# Patient Record
Sex: Female | Born: 1935 | Race: White | Hispanic: No | Marital: Married | Smoking: Former smoker
Health system: Southern US, Community
[De-identification: ages and names within clinical notes are randomized; demographics above are authoritative.]

## PROBLEM LIST (undated history)

## (undated) DIAGNOSIS — M1990 Unspecified osteoarthritis, unspecified site: Secondary | ICD-10-CM

## (undated) DIAGNOSIS — D1800 Hemangioma unspecified site: Secondary | ICD-10-CM

## (undated) DIAGNOSIS — K5790 Diverticulosis of intestine, part unspecified, without perforation or abscess without bleeding: Secondary | ICD-10-CM

## (undated) DIAGNOSIS — R87629 Unspecified abnormal cytological findings in specimens from vagina: Secondary | ICD-10-CM

## (undated) DIAGNOSIS — R519 Headache, unspecified: Secondary | ICD-10-CM

## (undated) DIAGNOSIS — D510 Vitamin B12 deficiency anemia due to intrinsic factor deficiency: Secondary | ICD-10-CM

## (undated) DIAGNOSIS — H409 Unspecified glaucoma: Secondary | ICD-10-CM

## (undated) DIAGNOSIS — D4989 Neoplasm of unspecified behavior of other specified sites: Secondary | ICD-10-CM

## (undated) DIAGNOSIS — R51 Headache: Secondary | ICD-10-CM

## (undated) DIAGNOSIS — N390 Urinary tract infection, site not specified: Secondary | ICD-10-CM

## (undated) DIAGNOSIS — D649 Anemia, unspecified: Secondary | ICD-10-CM

## (undated) DIAGNOSIS — L719 Rosacea, unspecified: Secondary | ICD-10-CM

## (undated) HISTORY — PX: BUNIONECTOMY: SHX129

## (undated) HISTORY — PX: OOPHORECTOMY: SHX86

## (undated) HISTORY — PX: ABDOMINAL HYSTERECTOMY: SHX81

## (undated) HISTORY — PX: EYE SURGERY: SHX253

## (undated) HISTORY — PX: TONSILLECTOMY: SUR1361

---

## 2003-07-14 HISTORY — PX: CATARACT EXTRACTION, BILATERAL: SHX1313

## 2004-05-18 ENCOUNTER — Emergency Department: Payer: Self-pay | Admitting: Emergency Medicine

## 2004-05-25 ENCOUNTER — Emergency Department: Payer: Self-pay | Admitting: Emergency Medicine

## 2004-05-26 ENCOUNTER — Ambulatory Visit (HOSPITAL_COMMUNITY): Admission: RE | Admit: 2004-05-26 | Discharge: 2004-05-26 | Payer: Self-pay | Admitting: Specialist

## 2004-06-09 ENCOUNTER — Ambulatory Visit (HOSPITAL_COMMUNITY): Admission: RE | Admit: 2004-06-09 | Discharge: 2004-06-09 | Payer: Self-pay | Admitting: Specialist

## 2004-07-05 ENCOUNTER — Emergency Department: Payer: Self-pay | Admitting: Emergency Medicine

## 2004-08-13 ENCOUNTER — Ambulatory Visit: Payer: Self-pay | Admitting: Unknown Physician Specialty

## 2004-09-09 ENCOUNTER — Ambulatory Visit: Payer: Self-pay | Admitting: Unknown Physician Specialty

## 2004-09-17 ENCOUNTER — Encounter: Payer: Self-pay | Admitting: Unknown Physician Specialty

## 2004-10-11 ENCOUNTER — Encounter: Payer: Self-pay | Admitting: Unknown Physician Specialty

## 2005-03-04 ENCOUNTER — Ambulatory Visit: Payer: Self-pay | Admitting: Obstetrics and Gynecology

## 2005-03-14 ENCOUNTER — Emergency Department: Payer: Self-pay | Admitting: Unknown Physician Specialty

## 2005-05-07 ENCOUNTER — Ambulatory Visit: Payer: Self-pay | Admitting: Urology

## 2005-05-15 ENCOUNTER — Ambulatory Visit: Payer: Self-pay | Admitting: Urology

## 2005-08-13 ENCOUNTER — Ambulatory Visit: Payer: Self-pay | Admitting: Gastroenterology

## 2006-01-26 ENCOUNTER — Ambulatory Visit: Payer: Self-pay | Admitting: Gastroenterology

## 2006-03-09 ENCOUNTER — Ambulatory Visit: Payer: Self-pay | Admitting: Obstetrics and Gynecology

## 2006-08-05 ENCOUNTER — Ambulatory Visit: Payer: Self-pay | Admitting: Gastroenterology

## 2007-01-19 IMAGING — US US RENAL KIDNEY
1 series · 17 of 25 positions shown · non-contrast
Comparison: none

REASON FOR EXAM: Recurent UTI's
COMMENTS:

[Series 1: us renal kidney · 17 of 29 slices shown]
[im 1/29]
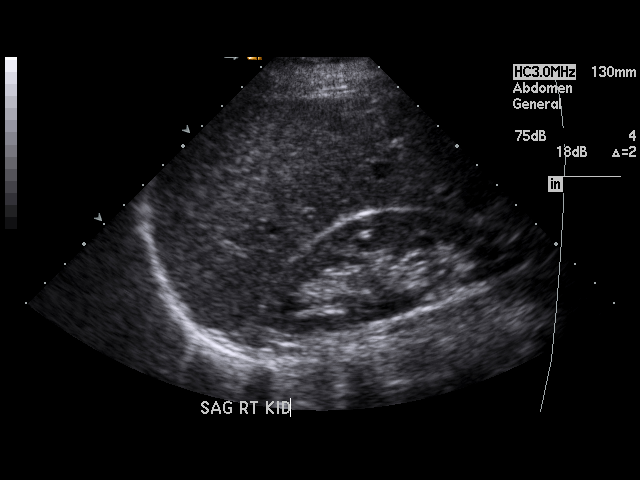
[im 3/29]
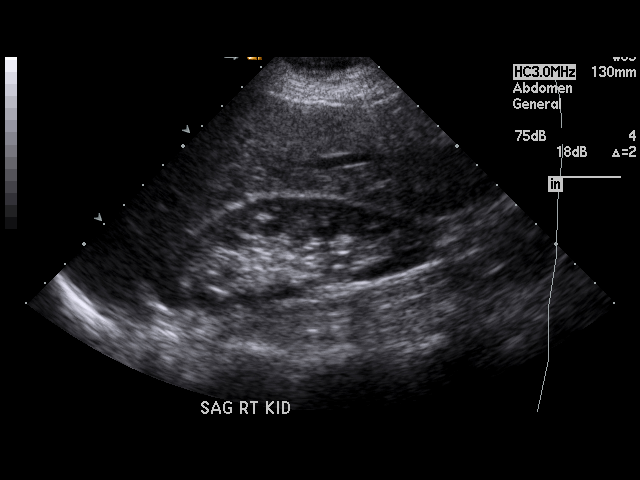
[im 4/29]
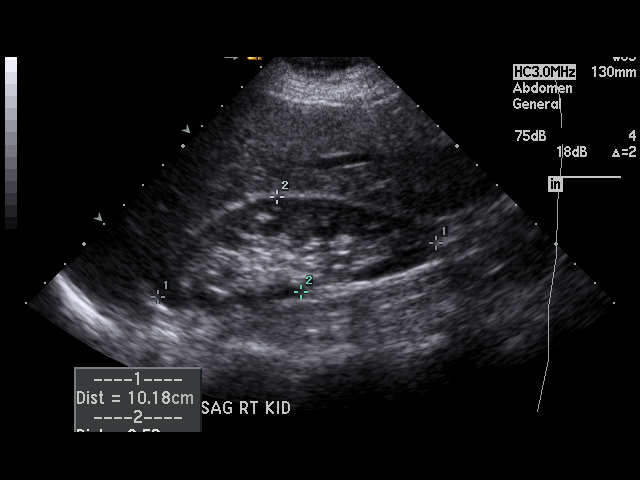
[im 6/29]
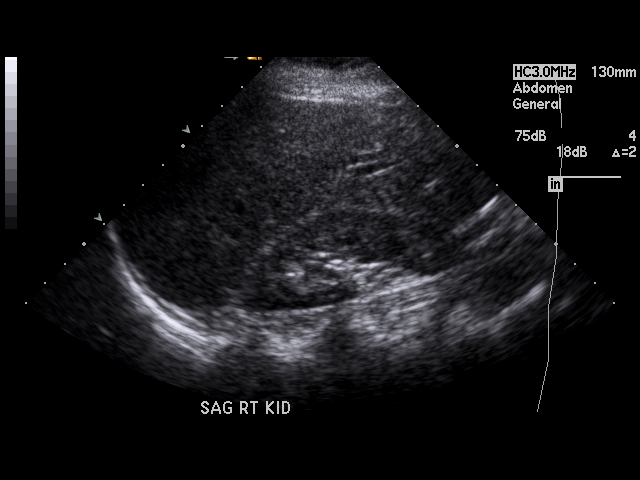
[im 8/29]
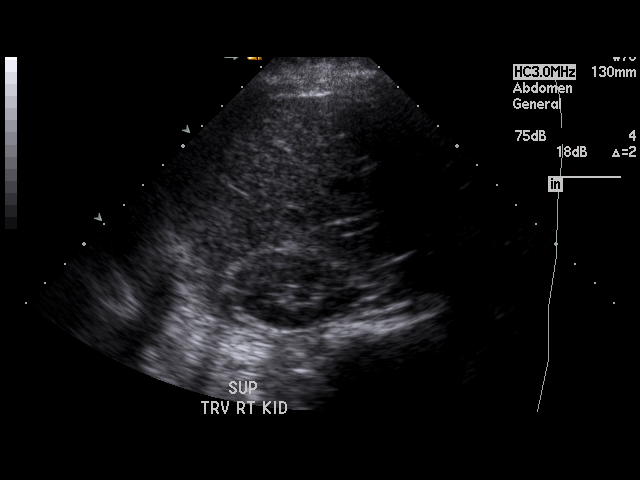
[im 10/29]
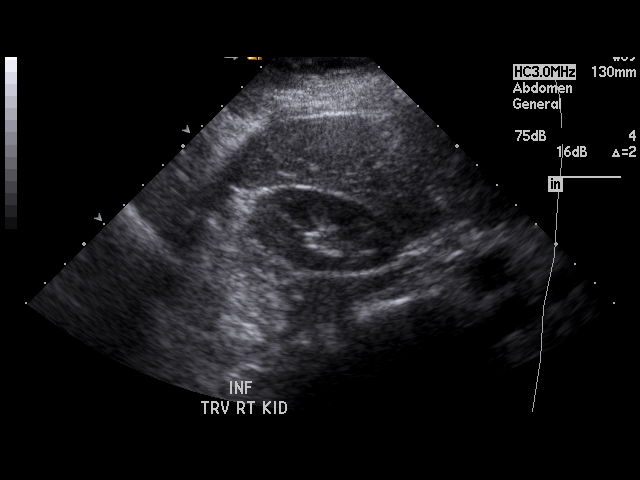
[im 11/29]
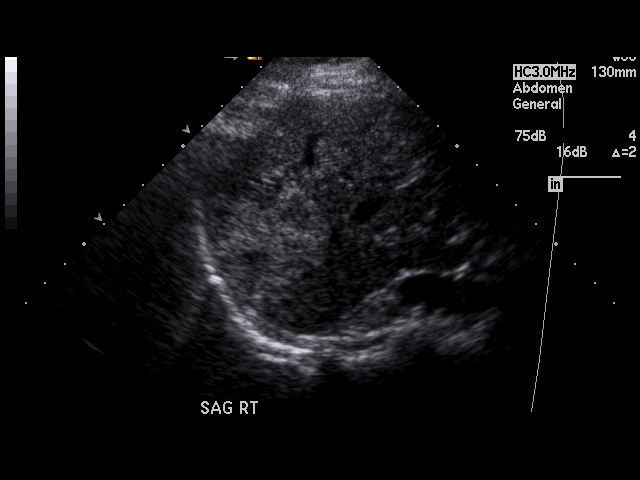
[im 13/29]
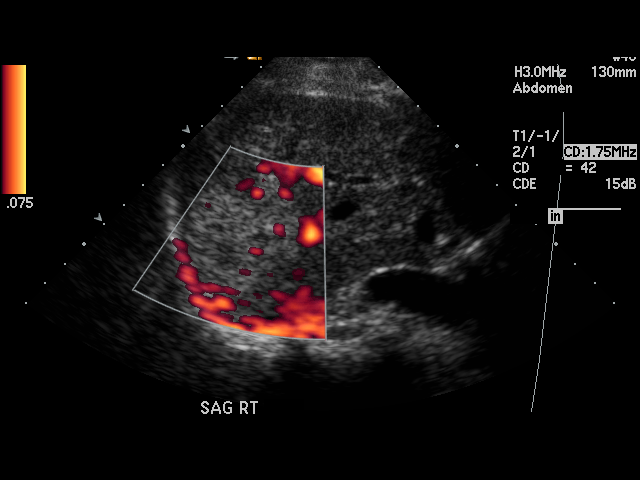
[im 15/29]
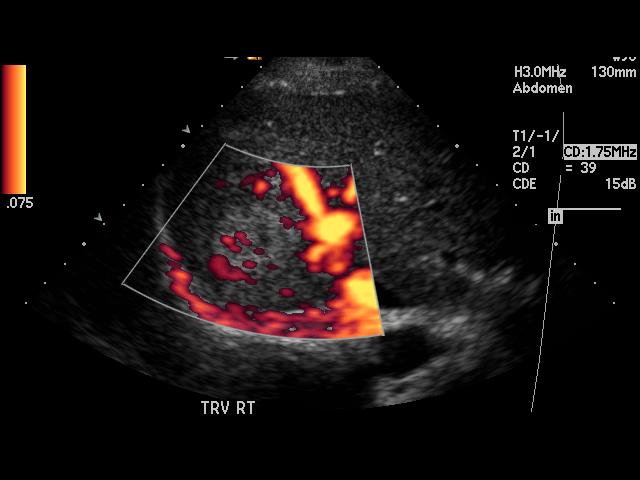
[im 16/29]
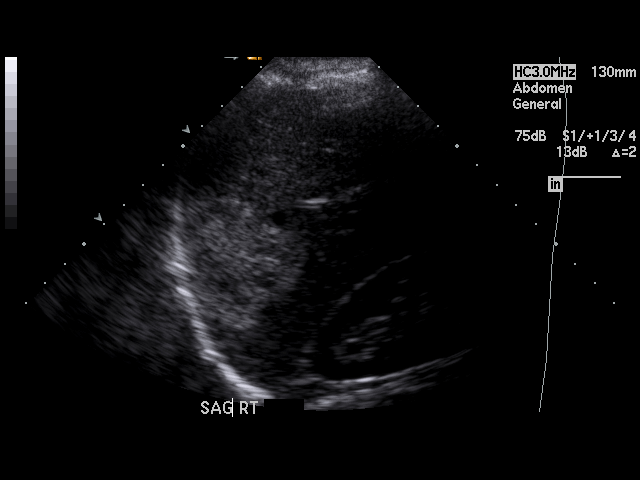
[im 18/29]
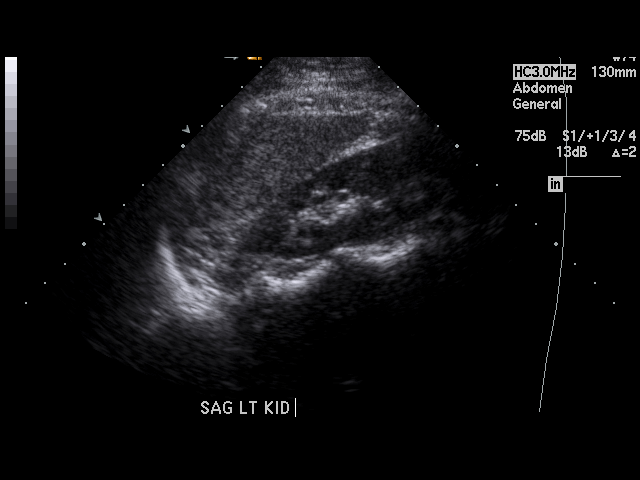
[im 19/29]
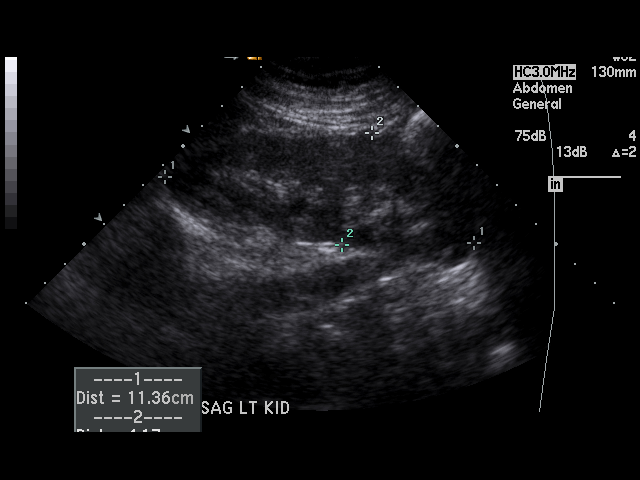
[im 22/29]
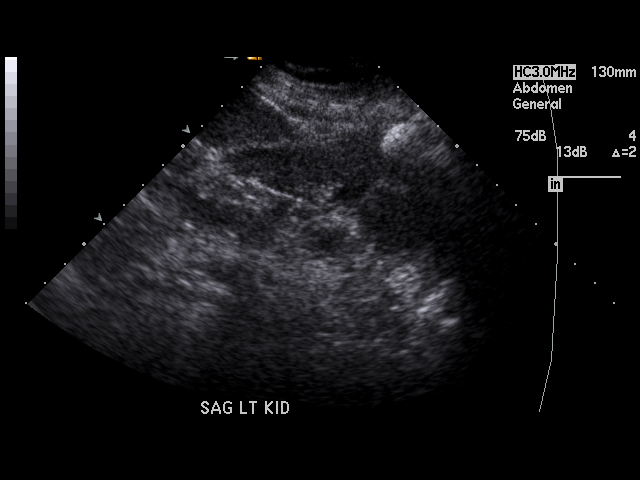
[im 23/29]
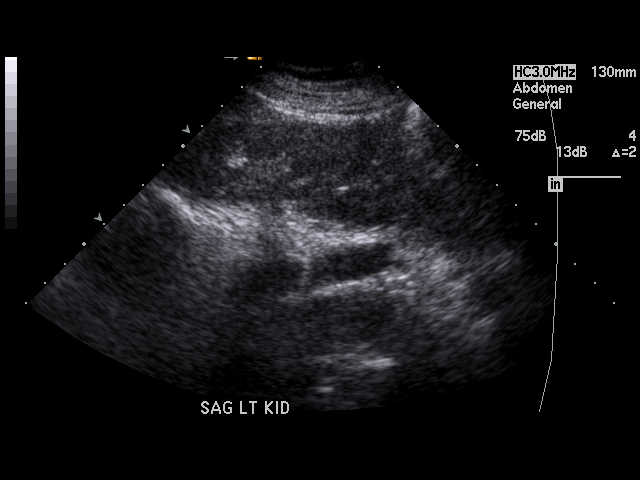
[im 25/29]
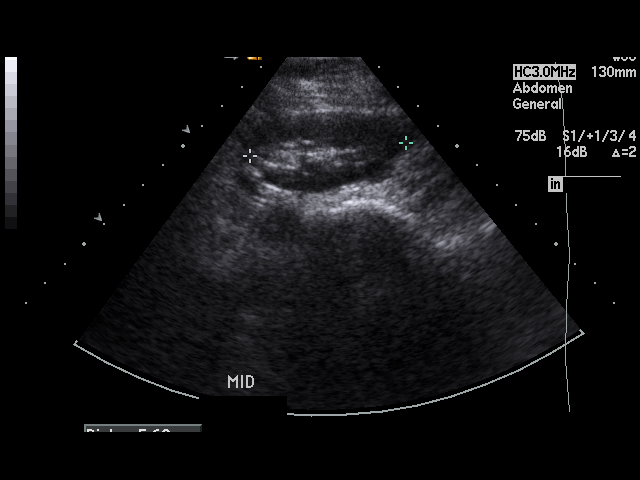
[im 26/29]
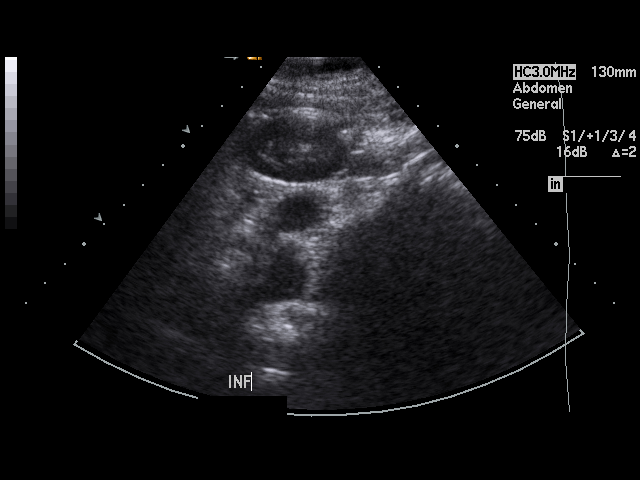
[im 29/29]
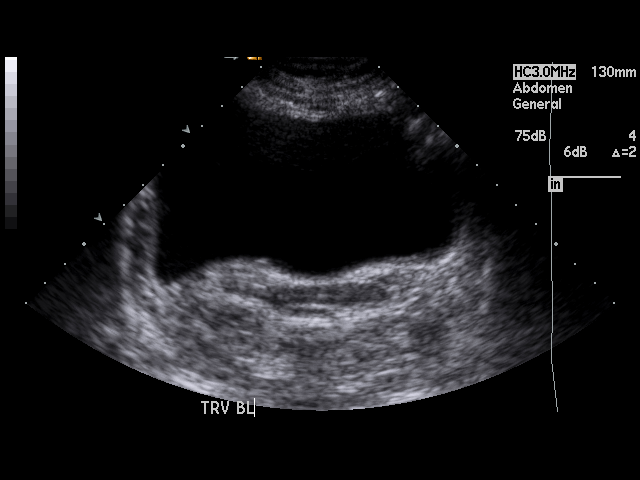

[17 of 25 positions shown; findings below may reference images not displayed]

PROCEDURE:     US  - US KIDNEY BILATERAL  - May 07, 2005  [DATE]

RESULT:     The RIGHT kidney measures 10.18 cm x 3.52 cm. The LEFT kidney
measures 11.27 cm x 4.12 cm. No solid or cystic renal mass lesions are seen.
No renal calcifications are identified. There is no hydronephrosis. The
visualized portion of the urinary bladder is normal in appearance. No
ascites is seen.

Incidental note is made of an ill-defined hypoechoic area in the RIGHT lobe
of the liver. The appearance is suspicious for a hepatic mass measuring
approximately 4.8 cm at maximum diameter. Further evaluation of the liver by
CT is suggested.
IMPRESSION: 1.     No significant sonographic abnormalities of the kidneys are
identified.
2.     Incidental views of the liver were obtained and show a hyperechoic
area suspicious for a hepatic mass lesion and for which hepatic CT is
recommended for further evaluation.

## 2007-03-11 ENCOUNTER — Ambulatory Visit: Payer: Self-pay | Admitting: Obstetrics and Gynecology

## 2007-08-04 ENCOUNTER — Ambulatory Visit: Payer: Self-pay | Admitting: Gastroenterology

## 2008-03-13 ENCOUNTER — Ambulatory Visit: Payer: Self-pay | Admitting: Obstetrics and Gynecology

## 2009-04-11 ENCOUNTER — Ambulatory Visit: Payer: Self-pay

## 2010-05-19 ENCOUNTER — Ambulatory Visit: Payer: Self-pay | Admitting: Obstetrics and Gynecology

## 2010-07-13 HISTORY — PX: OTHER SURGICAL HISTORY: SHX169

## 2012-09-08 ENCOUNTER — Ambulatory Visit: Payer: Self-pay | Admitting: General Practice

## 2015-08-30 ENCOUNTER — Encounter: Payer: Self-pay | Admitting: *Deleted

## 2015-09-02 ENCOUNTER — Ambulatory Visit: Payer: Medicare Other | Admitting: Anesthesiology

## 2015-09-02 ENCOUNTER — Encounter
Admission: RE | Disposition: A | Discharge status: Home / Self Care | Payer: Self-pay | Source: Ambulatory Visit | Attending: Gastroenterology

## 2015-09-02 ENCOUNTER — Encounter: Payer: Self-pay | Admitting: Anesthesiology

## 2015-09-02 ENCOUNTER — Ambulatory Visit
Admission: RE | Admit: 2015-09-02 | Discharge: 2015-09-02 | Disposition: A | Discharge status: Home / Self Care | Payer: Medicare Other | Source: Ambulatory Visit | Attending: Gastroenterology | Admitting: Gastroenterology

## 2015-09-02 DIAGNOSIS — Z882 Allergy status to sulfonamides status: Secondary | ICD-10-CM | POA: Diagnosis not present

## 2015-09-02 DIAGNOSIS — Q438 Other specified congenital malformations of intestine: Secondary | ICD-10-CM | POA: Insufficient documentation

## 2015-09-02 DIAGNOSIS — Z888 Allergy status to other drugs, medicaments and biological substances status: Secondary | ICD-10-CM | POA: Diagnosis not present

## 2015-09-02 DIAGNOSIS — K624 Stenosis of anus and rectum: Secondary | ICD-10-CM | POA: Diagnosis not present

## 2015-09-02 DIAGNOSIS — Z79899 Other long term (current) drug therapy: Secondary | ICD-10-CM | POA: Insufficient documentation

## 2015-09-02 DIAGNOSIS — K625 Hemorrhage of anus and rectum: Secondary | ICD-10-CM | POA: Insufficient documentation

## 2015-09-02 DIAGNOSIS — K5730 Diverticulosis of large intestine without perforation or abscess without bleeding: Secondary | ICD-10-CM | POA: Diagnosis not present

## 2015-09-02 HISTORY — DX: Neoplasm of unspecified behavior of other specified sites: D49.89

## 2015-09-02 HISTORY — DX: Anemia, unspecified: D64.9

## 2015-09-02 HISTORY — DX: Urinary tract infection, site not specified: N39.0

## 2015-09-02 HISTORY — PX: COLONOSCOPY WITH PROPOFOL: SHX5780

## 2015-09-02 HISTORY — DX: Rosacea, unspecified: L71.9

## 2015-09-02 HISTORY — DX: Unspecified abnormal cytological findings in specimens from vagina: R87.629

## 2015-09-02 HISTORY — DX: Hemangioma unspecified site: D18.00

## 2015-09-02 HISTORY — DX: Unspecified glaucoma: H40.9

## 2015-09-02 HISTORY — DX: Headache, unspecified: R51.9

## 2015-09-02 HISTORY — DX: Headache: R51

## 2015-09-02 SURGERY — COLONOSCOPY WITH PROPOFOL
Anesthesia: General

## 2015-09-02 MED ORDER — SODIUM CHLORIDE 0.9 % IV SOLN
INTRAVENOUS | Status: DC
  Administered 2015-09-02: 09:00:00 via INTRAVENOUS

## 2015-09-02 MED ORDER — PROPOFOL 10 MG/ML IV BOLUS
INTRAVENOUS | Status: DC | PRN
  Administered 2015-09-02: 50 mg via INTRAVENOUS

## 2015-09-02 MED ORDER — PROPOFOL 500 MG/50ML IV EMUL
INTRAVENOUS | Status: DC | PRN
  Administered 2015-09-02: 100 ug/kg/min via INTRAVENOUS

## 2015-09-02 MED ORDER — EPHEDRINE SULFATE 50 MG/ML IJ SOLN
INTRAMUSCULAR | Status: DC | PRN
  Administered 2015-09-02: 20 mg via INTRAVENOUS

## 2015-09-02 MED ORDER — SODIUM CHLORIDE 0.9 % IV SOLN
INTRAVENOUS | Status: DC
Start: 2015-09-02 — End: 2015-09-02
  Administered 2015-09-02: 09:00:00 via INTRAVENOUS

## 2015-09-02 NOTE — H&P (Signed)
Outpatient short stay form Pre-procedure 09/02/2015 8:53 AM Lollie Sails MD  Primary Physician: Mortimer Fries PA  Reason for visit:  Colonoscopy  History of present illness:  Patient is a 82 year old female presenting today for colonoscopy. She is a little bit overdue on her: Cancer screening. She has been having some rectal bleeding and her minimally and has a history of hemorrhoids. There is no family history of colon cancer or colon polyps. He had some nausea with her prep but states that she is going clear this morning. Takes no aspirin or blood thinning products.    Current facility-administered medications:  .  0.9 %  sodium chloride infusion, , Intravenous, Continuous, Lollie Sails, MD  Prescriptions prior to admission  Medication Sig Dispense Refill Last Dose  . alclomethasone (ACLOVATE) 0.05 % ointment Apply topically 2 (two) times daily.     . CYANOCOBALAMIN IJ Inject 1,000 mcg as directed.     . fluocinonide cream (LIDEX) [id number] % Apply 1 application topically 2 (two) times daily.     . hydrocortisone (ANUSOL-HC) 2.5 % rectal cream Place 1 application rectally 2 (two) times daily.     Marland Kitchen ibuprofen (ADVIL) 200 MG tablet Take 400 mg by mouth 3 (three) times daily.     Marland Kitchen latanoprost (XALATAN) 0.005 % ophthalmic solution Place 1 drop into both eyes at bedtime.     . metroNIDAZOLE (METROCREAM) 0.75 % cream Apply topically 2 (two) times daily.     . polyethylene glycol powder (GLYCOLAX/MIRALAX) powder Take 1 Container by mouth once.        Allergies  Allergen Reactions  . Prednisone   . Pyridium [Phenazopyridine Hcl]   . Sulfa Antibiotics      Past Medical History  Diagnosis Date  . Abnormal Pap smear of vagina   . Glaucoma (increased eye pressure)   . Headache   . Multiple hemangiomas   . Anemia     pernicious  . Rosacea   . Tumor of jaw   . UTI (urinary tract infection)     Review of systems:      Physical Exam    Heart and lungs: Regular rate and  rhythm without rub or gallop, lungs are bilaterally clear.    HEENT: Normocephalic atraumatic eyes are anicteric.    Other:     Pertinant exam for procedure: Soft nontender nondistended bowel sounds positive normoactive.    Planned proceedures: Colonoscopy and indicated procedures. I have discussed the risks benefits and complications of procedures to include not limited to bleeding, infection, perforation and the risk of sedation and the patient wishes to proceed.    Lollie Sails, MD Gastroenterology 09/02/2015  8:53 AM

## 2015-09-02 NOTE — Op Note (Signed)
[facility] Gastroenterology Patient Name: Monica Gonzales Procedure Date: 09/02/2015 9:18 AM MRN: [id number] Account #: [id number] Date of Birth: 06/19/1934 Admit Type: Outpatient Age: 82 Room: [facility] ENDO ROOM 3 Gender: Female Note Status: Finalized Procedure:            Colonoscopy Indications:          Rectal bleeding Providers:            Lollie Sails, MD Referring MD:         Youlanda Roys. Ola Spurr, MD (Referring MD) Medicines:            Monitored Anesthesia Care Complications:        No immediate complications. Procedure:            Pre-Anesthesia Assessment:                       - ASA Grade Assessment: II - A patient with mild                        systemic disease.                       After obtaining informed consent, the colonoscope was                        passed under direct vision. Throughout the procedure,                        the patient's blood pressure, pulse, and oxygen                        saturations were monitored continuously. The                        Colonoscope was introduced through the anus with the                        intention of advancing to the cecum. The scope was                        advanced to the descending colon before the procedure                        was aborted. Medications were given. The quality of the                        bowel preparation was good. Findings:      Multiple small-mouthed diverticula were found in the sigmoid colon and       descending colon.      A benign-appearing, intrinsic moderate stenosis measuring less than one       cm (in length) x 1.3 cm (inner diameter) was found at the anus and was       traversed.      The sigmoid colon and descending colon were significantly tortuous.      The scope was advanced to the area of the mid to proximal descending       before encountering a very sharp angulation the could not be negotiated       despite multiple position changes, position  change and change of scope.  The lumen appeared to be very narrow at this point as well. Impression:           - Diverticulosis in the sigmoid colon and in the                        descending colon.                       - Stricture at the anus.                       - Tortuous colon.                       - No specimens collected. Recommendation:       - Discharge patient to home.                       - Perform an air contrast barium enema at appointment                        to be scheduled.                       - Will discuss the anal stenosis further with patient,                        and refer to Surgery for possible management if desired. Procedure Code(s):    --- Professional ---                       [phone], 59, Colonoscopy, flexible; diagnostic, including                        collection of specimen(s) by brushing or washing, when                        performed (separate procedure) Diagnosis Code(s):    --- Professional ---                       K62.4, Stenosis of anus and rectum                       K62.5, Hemorrhage of anus and rectum                       K57.30, Diverticulosis of large intestine without                        perforation or abscess without bleeding                       Q43.8, Other specified congenital malformations of                        intestine CPT copyright 2016 American Medical Association. All rights reserved. The codes documented in this report are preliminary and upon coder review may  be revised to meet current compliance requirements. Lollie Sails, MD 09/02/2015 9:54:35 AM This report has been signed electronically. Number of Addenda: 0 Note Initiated On: 09/02/2015 9:18 AM Total Procedure Duration: 0 hours 22 minutes 48 seconds  [facility]

## 2015-09-02 NOTE — Anesthesia Preprocedure Evaluation (Addendum)
Anesthesia Evaluation  Patient identified by MRN, date of birth, ID band Patient awake    Reviewed: Allergy & Precautions, H&P , NPO status , Patient's Chart, lab work & pertinent test results, reviewed documented beta blocker date and time   History of Anesthesia Complications Negative for: history of anesthetic complications  Airway Mallampati: II  TM Distance: <3 FB Neck ROM: full  Mouth opening: Limited Mouth Opening  Dental no notable dental hx. (+) Upper Dentures, Edentulous Upper, Missing   Pulmonary neg pulmonary ROS,    Pulmonary exam normal breath sounds clear to auscultation       Cardiovascular Exercise Tolerance: Good negative cardio ROS Normal cardiovascular exam Rhythm:regular Rate:Normal     Neuro/Psych negative neurological ROS  negative psych ROS   GI/Hepatic negative GI ROS, Neg liver ROS,   Endo/Other  negative endocrine ROS  Renal/GU negative Renal ROS  negative genitourinary   Musculoskeletal   Abdominal   Peds  Hematology negative hematology ROS (+)   Anesthesia Other Findings Past Medical History:   Abnormal Pap smear of vagina                                 Glaucoma (increased eye pressure)                            Headache                                                     Multiple hemangiomas                                         Anemia                                                         Comment:pernicious   Rosacea                                                      Tumor of jaw                                                 UTI (urinary tract infection)                                Reproductive/Obstetrics negative OB ROS                             Anesthesia Physical Anesthesia Plan  ASA: II  Anesthesia Plan: General   Post-op Pain Management:    Induction:   Airway Management Planned:  Additional Equipment:   Intra-op Plan:    Post-operative Plan:   Informed Consent: I have reviewed the patients History and Physical, chart, labs and discussed the procedure including the risks, benefits and alternatives for the proposed anesthesia with the patient or authorized representative who has indicated his/her understanding and acceptance.   Dental Advisory Given  Plan Discussed with: Anesthesiologist, CRNA and Surgeon  Anesthesia Plan Comments:        Anesthesia Quick Evaluation

## 2015-09-02 NOTE — Anesthesia Postprocedure Evaluation (Signed)
Anesthesia Post Note  Patient: Monica Gonzales  Procedure(s) Performed: Procedure(s) (LRB): COLONOSCOPY WITH PROPOFOL (N/A)  Patient location during evaluation: PACU Anesthesia Type: General Level of consciousness: awake and alert Pain management: pain level controlled Vital Signs Assessment: post-procedure vital signs reviewed and stable Respiratory status: spontaneous breathing, nonlabored ventilation, respiratory function stable and patient connected to nasal cannula oxygen Cardiovascular status: blood pressure returned to baseline and stable Postop Assessment: no signs of nausea or vomiting Anesthetic complications: no    Last Vitals:  Filed Vitals:   09/02/15 1010 09/02/15 1020  BP: 128/62 128/56  Pulse: 98 99  Temp:    Resp: 19 18    Last Pain: There were no vitals filed for this visit.               Martha Clan

## 2015-09-02 NOTE — Transfer of Care (Signed)
Immediate Anesthesia Transfer of Care Note  Patient: Monica Gonzales  Procedure(s) Performed: Procedure(s): COLONOSCOPY WITH PROPOFOL (N/A)  Patient Location: PACU  Anesthesia Type:General  Level of Consciousness: awake  Airway & Oxygen Therapy: Patient Spontanous Breathing  Post-op Assessment: Report given to RN  Post vital signs: Reviewed and stable  Last Vitals:  Filed Vitals:   09/02/15 0900 09/02/15 0952  BP: 152/61 117/49  Pulse: 81 85  Temp: 36.7 C   Resp: 17 22    Complications: No apparent anesthesia complications

## 2015-09-03 ENCOUNTER — Encounter: Payer: Self-pay | Admitting: Gastroenterology

## 2015-09-03 ENCOUNTER — Other Ambulatory Visit: Payer: Self-pay | Admitting: Gastroenterology

## 2015-09-03 DIAGNOSIS — Q438 Other specified congenital malformations of intestine: Secondary | ICD-10-CM

## 2015-09-12 ENCOUNTER — Ambulatory Visit: Payer: Medicare Other

## 2016-09-29 ENCOUNTER — Inpatient Hospital Stay
Admission: RE | Admit: 2016-09-29 | Discharge: 2016-09-29 | Disposition: A | Discharge status: Home / Self Care | Payer: Self-pay | Source: Ambulatory Visit | Attending: *Deleted | Admitting: *Deleted

## 2016-09-29 ENCOUNTER — Other Ambulatory Visit: Payer: Self-pay | Admitting: *Deleted

## 2016-09-29 DIAGNOSIS — Z9289 Personal history of other medical treatment: Secondary | ICD-10-CM

## 2016-10-05 ENCOUNTER — Other Ambulatory Visit: Payer: Self-pay | Admitting: Infectious Diseases

## 2016-10-05 DIAGNOSIS — Z1231 Encounter for screening mammogram for malignant neoplasm of breast: Secondary | ICD-10-CM

## 2016-10-30 ENCOUNTER — Ambulatory Visit
Admission: RE | Admit: 2016-10-30 | Discharge: 2016-10-30 | Disposition: A | Discharge status: Home / Self Care | Payer: Medicare Other | Source: Ambulatory Visit | Attending: Infectious Diseases | Admitting: Infectious Diseases

## 2016-10-30 DIAGNOSIS — Z1231 Encounter for screening mammogram for malignant neoplasm of breast: Secondary | ICD-10-CM | POA: Diagnosis present

## 2018-05-10 ENCOUNTER — Encounter
Admission: RE | Admit: 2018-05-10 | Discharge: 2018-05-10 | Disposition: A | Discharge status: Home / Self Care | Payer: Medicare Other | Source: Ambulatory Visit | Attending: Orthopedic Surgery | Admitting: Orthopedic Surgery

## 2018-05-10 ENCOUNTER — Other Ambulatory Visit: Payer: Self-pay

## 2018-05-10 DIAGNOSIS — Z01818 Encounter for other preprocedural examination: Secondary | ICD-10-CM | POA: Diagnosis not present

## 2018-05-10 DIAGNOSIS — Z01810 Encounter for preprocedural cardiovascular examination: Secondary | ICD-10-CM

## 2018-05-10 HISTORY — DX: Vitamin B12 deficiency anemia due to intrinsic factor deficiency: D51.0

## 2018-05-10 HISTORY — DX: Diverticulosis of intestine, part unspecified, without perforation or abscess without bleeding: K57.90

## 2018-05-10 HISTORY — DX: Unspecified osteoarthritis, unspecified site: M19.90

## 2018-05-10 LAB — BASIC METABOLIC PANEL
ANION GAP: 10 (ref 5–15)
BUN: 12 mg/dL (ref 8–23)
CALCIUM: 9.3 mg/dL (ref 8.9–10.3)
CHLORIDE: 100 mmol/L (ref 98–111)
CO2: 28 mmol/L (ref 22–32)
CREATININE: 0.83 mg/dL (ref 0.44–1.00)
Glucose, Bld: 95 mg/dL (ref 70–99)
Potassium: 4 mmol/L (ref 3.5–5.1)
Sodium: 138 mmol/L (ref 135–145)

## 2018-05-10 LAB — PROTIME-INR
INR: 0.95
Prothrombin Time: 12.6 seconds (ref 11.4–15.2)

## 2018-05-10 LAB — SURGICAL PCR SCREEN
MRSA, PCR: NEGATIVE
Staphylococcus aureus: NEGATIVE

## 2018-05-10 LAB — URINALYSIS, ROUTINE W REFLEX MICROSCOPIC
BILIRUBIN URINE: NEGATIVE
Glucose, UA: NEGATIVE mg/dL
Ketones, ur: NEGATIVE mg/dL
Leukocytes, UA: NEGATIVE
Nitrite: NEGATIVE
PROTEIN: NEGATIVE mg/dL
Specific Gravity, Urine: 1.006 (ref 1.005–1.030)
pH: 6 (ref 5.0–8.0)

## 2018-05-10 LAB — CBC
HCT: 43 % (ref 36.0–46.0)
HEMOGLOBIN: 14.1 g/dL (ref 12.0–15.0)
MCH: 31.7 pg (ref 26.0–34.0)
MCHC: 32.8 g/dL (ref 30.0–36.0)
MCV: 96.6 fL (ref 80.0–100.0)
Platelets: 179 10*3/uL (ref 150–400)
RBC: 4.45 MIL/uL (ref 3.87–5.11)
RDW: 12 % (ref 11.5–15.5)
WBC: 6.6 10*3/uL (ref 4.0–10.5)
nRBC: 0 % (ref 0.0–0.2)

## 2018-05-10 LAB — APTT: aPTT: 24 seconds (ref 24–36)

## 2018-05-10 LAB — SEDIMENTATION RATE: SED RATE: 15 mm/h (ref 0–30)

## 2018-05-10 NOTE — Patient Instructions (Signed)
Your procedure is scheduled on: Tuesday, May 17, 2018 Report to Day Surgery on the 2nd floor of the [facility]. To find out your arrival time, please call [phone] between 1PM - 3PM on: Monday, May 16, 2018  REMEMBER: Instructions that are not followed completely may result in serious medical risk, up to and including death; or upon the discretion of your surgeon and anesthesiologist your surgery may need to be rescheduled.  Do not eat food after midnight the night before surgery.  No gum chewing, lozengers or hard candies.  You may however, drink CLEAR liquids up to 2 hours before you are scheduled to arrive for your surgery. Do not drink anything within 2 hours of the start of your surgery.  Clear liquids include: - water  - apple juice without pulp - gatorade - black coffee or tea (Do NOT add milk or creamers to the coffee or tea) Do NOT drink anything that is not on this list.  No Alcohol for 24 hours before or after surgery.  No Smoking including e-cigarettes for 24 hours prior to surgery.  No chewable tobacco products for at least 6 hours prior to surgery.  No nicotine patches on the day of surgery.  On the morning of surgery brush your teeth with toothpaste and water, you may rinse your mouth with mouthwash if you wish. Do not swallow any toothpaste or mouthwash.  Notify your doctor if there is any change in your medical condition (cold, fever, infection).  Do not wear jewelry, make-up, hairpins, clips or nail polish.  Do not wear lotions, powders, or perfumes.   Do not shave 48 hours prior to surgery.   Contacts and dentures may not be worn into surgery.  Do not bring valuables to the hospital, including drivers license, insurance or credit cards.  [facility] is not responsible for any belongings or valuables.   TAKE THESE MEDICATIONS THE MORNING OF SURGERY:  none  Use CHG Soap as directed on instruction sheet.  NOW!  Stop Anti-inflammatories  (NSAIDS) such as Advil, Aleve, Ibuprofen, Motrin, Naproxen, Naprosyn and Aspirin based products such as Excedrin, Goodys Powder, BC Powder. (May take Tylenol or Acetaminophen if needed.)  NOW!  Stop ANY OVER THE COUNTER supplements until after surgery. (May continue Vitamin B.)  Wear comfortable clothing (specific to your surgery type) to the hospital.  Plan for stool softeners for home use.  If you are being admitted to the hospital overnight, leave your suitcase in the car. After surgery it may be brought to your room.  Please call [phone] if you have any questions about these instructions.

## 2018-05-11 LAB — URINE CULTURE: CULTURE: NO GROWTH

## 2018-05-16 MED ORDER — CEFAZOLIN SODIUM-DEXTROSE 2-4 GM/100ML-% IV SOLN
2.0000 g | Freq: Once | INTRAVENOUS | Status: AC
  Administered 2018-05-17: 2 g via INTRAVENOUS

## 2018-05-17 ENCOUNTER — Encounter
Admission: RE | Disposition: A | Discharge status: Home Health | Payer: Self-pay | Source: Home / Self Care | Attending: Orthopedic Surgery

## 2018-05-17 ENCOUNTER — Inpatient Hospital Stay
Admission: RE | Admit: 2018-05-17 | Discharge: 2018-05-20 | DRG: 470 | Disposition: A | Discharge status: Home Health | Payer: Medicare Other | Attending: Orthopedic Surgery | Admitting: Orthopedic Surgery

## 2018-05-17 ENCOUNTER — Inpatient Hospital Stay: Payer: Medicare Other | Admitting: Anesthesiology

## 2018-05-17 ENCOUNTER — Other Ambulatory Visit: Payer: Self-pay

## 2018-05-17 ENCOUNTER — Inpatient Hospital Stay: Payer: Medicare Other

## 2018-05-17 ENCOUNTER — Encounter: Payer: Self-pay | Admitting: *Deleted

## 2018-05-17 DIAGNOSIS — L439 Lichen planus, unspecified: Secondary | ICD-10-CM | POA: Diagnosis present

## 2018-05-17 DIAGNOSIS — G8918 Other acute postprocedural pain: Secondary | ICD-10-CM

## 2018-05-17 DIAGNOSIS — Z419 Encounter for procedure for purposes other than remedying health state, unspecified: Secondary | ICD-10-CM

## 2018-05-17 DIAGNOSIS — Z833 Family history of diabetes mellitus: Secondary | ICD-10-CM

## 2018-05-17 DIAGNOSIS — H409 Unspecified glaucoma: Secondary | ICD-10-CM | POA: Diagnosis present

## 2018-05-17 DIAGNOSIS — M1611 Unilateral primary osteoarthritis, right hip: Principal | ICD-10-CM | POA: Diagnosis present

## 2018-05-17 DIAGNOSIS — Z90710 Acquired absence of both cervix and uterus: Secondary | ICD-10-CM | POA: Diagnosis not present

## 2018-05-17 DIAGNOSIS — Z8041 Family history of malignant neoplasm of ovary: Secondary | ICD-10-CM

## 2018-05-17 DIAGNOSIS — Z87891 Personal history of nicotine dependence: Secondary | ICD-10-CM

## 2018-05-17 DIAGNOSIS — Z882 Allergy status to sulfonamides status: Secondary | ICD-10-CM | POA: Diagnosis not present

## 2018-05-17 DIAGNOSIS — Z811 Family history of alcohol abuse and dependence: Secondary | ICD-10-CM

## 2018-05-17 DIAGNOSIS — Z791 Long term (current) use of non-steroidal anti-inflammatories (NSAID): Secondary | ICD-10-CM

## 2018-05-17 DIAGNOSIS — Z888 Allergy status to other drugs, medicaments and biological substances status: Secondary | ICD-10-CM

## 2018-05-17 DIAGNOSIS — Z9849 Cataract extraction status, unspecified eye: Secondary | ICD-10-CM | POA: Diagnosis not present

## 2018-05-17 DIAGNOSIS — Z79899 Other long term (current) drug therapy: Secondary | ICD-10-CM

## 2018-05-17 DIAGNOSIS — Z96641 Presence of right artificial hip joint: Secondary | ICD-10-CM

## 2018-05-17 HISTORY — PX: TOTAL HIP ARTHROPLASTY: SHX124

## 2018-05-17 LAB — ABO/RH: ABO/RH(D): A POS

## 2018-05-17 LAB — TYPE AND SCREEN
ABO/RH(D): A POS
Antibody Screen: NEGATIVE

## 2018-05-17 SURGERY — ARTHROPLASTY, HIP, TOTAL, ANTERIOR APPROACH
Anesthesia: Spinal | Site: Hip | Laterality: Right

## 2018-05-17 MED ORDER — MAGNESIUM CITRATE PO SOLN
1.0000 | Freq: Once | ORAL | Status: AC | PRN
  Administered 2018-05-19: 1 via ORAL
  Filled 2018-05-17 (×3): qty 296

## 2018-05-17 MED ORDER — TRANEXAMIC ACID-NACL 1000-0.7 MG/100ML-% IV SOLN
INTRAVENOUS | Status: DC | PRN
  Administered 2018-05-17: 1000 mg via INTRAVENOUS

## 2018-05-17 MED ORDER — BISACODYL 5 MG PO TBEC
5.0000 mg | DELAYED_RELEASE_TABLET | Freq: Every day | ORAL | Status: DC | PRN
  Administered 2018-05-19: 5 mg via ORAL
  Filled 2018-05-17: qty 1

## 2018-05-17 MED ORDER — FAMOTIDINE 20 MG PO TABS
20.0000 mg | ORAL_TABLET | Freq: Once | ORAL | Status: AC
  Administered 2018-05-17: 20 mg via ORAL

## 2018-05-17 MED ORDER — ASPIRIN 81 MG PO CHEW
81.0000 mg | CHEWABLE_TABLET | Freq: Two times a day (BID) | ORAL | Status: DC
  Administered 2018-05-17 – 2018-05-20 (×6): 81 mg via ORAL
  Filled 2018-05-17 (×6): qty 1

## 2018-05-17 MED ORDER — CEFAZOLIN SODIUM-DEXTROSE 1-4 GM/50ML-% IV SOLN
1.0000 g | Freq: Four times a day (QID) | INTRAVENOUS | Status: AC
  Administered 2018-05-17 – 2018-05-18 (×2): 1 g via INTRAVENOUS
  Filled 2018-05-17 (×2): qty 50

## 2018-05-17 MED ORDER — HYDROCODONE-ACETAMINOPHEN 5-325 MG PO TABS
1.0000 | ORAL_TABLET | ORAL | Status: DC | PRN
  Administered 2018-05-17: 1 via ORAL
  Filled 2018-05-17: qty 1

## 2018-05-17 MED ORDER — BUPIVACAINE HCL (PF) 0.5 % IJ SOLN: INTRAMUSCULAR | Status: DC | PRN

## 2018-05-17 MED ORDER — MORPHINE SULFATE (PF) 2 MG/ML IV SOLN: 0.5000 mg | INTRAVENOUS | Status: DC | PRN

## 2018-05-17 MED ORDER — SODIUM CHLORIDE 0.9 % IV SOLN
INTRAVENOUS | Status: DC | PRN
  Administered 2018-05-17: 50 ug via INTRAVENOUS

## 2018-05-17 MED ORDER — ACETAMINOPHEN 325 MG PO TABS
325.0000 mg | ORAL_TABLET | Freq: Four times a day (QID) | ORAL | Status: DC | PRN
  Filled 2018-05-17: qty 2

## 2018-05-17 MED ORDER — EPHEDRINE SULFATE 50 MG/ML IJ SOLN
INTRAMUSCULAR | Status: DC | PRN
  Administered 2018-05-17: 15 mg via INTRAVENOUS

## 2018-05-17 MED ORDER — METOCLOPRAMIDE HCL 10 MG PO TABS: 5.0000 mg | ORAL_TABLET | Freq: Three times a day (TID) | ORAL | Status: DC | PRN

## 2018-05-17 MED ORDER — PSYLLIUM 95 % PO PACK
1.0000 | PACK | Freq: Every day | ORAL | Status: DC
  Administered 2018-05-18 – 2018-05-19 (×2): 1 via ORAL
  Filled 2018-05-17 (×4): qty 1

## 2018-05-17 MED ORDER — GABAPENTIN 300 MG PO CAPS
300.0000 mg | ORAL_CAPSULE | Freq: Three times a day (TID) | ORAL | Status: DC
  Administered 2018-05-17 – 2018-05-20 (×9): 300 mg via ORAL
  Filled 2018-05-17 (×10): qty 1

## 2018-05-17 MED ORDER — FAMOTIDINE 20 MG PO TABS
ORAL_TABLET | ORAL | Status: AC
  Filled 2018-05-17: qty 1

## 2018-05-17 MED ORDER — BUPIVACAINE-EPINEPHRINE 0.25% -1:200000 IJ SOLN
INTRAMUSCULAR | Status: DC | PRN
  Administered 2018-05-17: 30 mL

## 2018-05-17 MED ORDER — MIDAZOLAM HCL 5 MG/5ML IJ SOLN
INTRAMUSCULAR | Status: DC | PRN
  Administered 2018-05-17 (×2): 1 mg via INTRAVENOUS

## 2018-05-17 MED ORDER — TRANEXAMIC ACID-NACL 1000-0.7 MG/100ML-% IV SOLN
1000.0000 mg | INTRAVENOUS | Status: DC
  Filled 2018-05-17: qty 100

## 2018-05-17 MED ORDER — BUPIVACAINE HCL (PF) 0.5 % IJ SOLN
INTRAMUSCULAR | Status: DC | PRN
  Administered 2018-05-17: 2 mL

## 2018-05-17 MED ORDER — DIPHENHYDRAMINE HCL 12.5 MG/5ML PO ELIX: 12.5000 mg | ORAL_SOLUTION | ORAL | Status: DC | PRN

## 2018-05-17 MED ORDER — PROPOFOL 500 MG/50ML IV EMUL
INTRAVENOUS | Status: DC | PRN
  Administered 2018-05-17: 50 ug/kg/min via INTRAVENOUS

## 2018-05-17 MED ORDER — ZOLPIDEM TARTRATE 5 MG PO TABS: 5.0000 mg | ORAL_TABLET | Freq: Every evening | ORAL | Status: DC | PRN

## 2018-05-17 MED ORDER — TRAMADOL HCL 50 MG PO TABS
50.0000 mg | ORAL_TABLET | Freq: Four times a day (QID) | ORAL | Status: DC
  Administered 2018-05-18 (×3): 50 mg via ORAL
  Administered 2018-05-19: 25 mg via ORAL
  Filled 2018-05-17 (×5): qty 1

## 2018-05-17 MED ORDER — PHENYLEPHRINE HCL 10 MG/ML IJ SOLN
INTRAMUSCULAR | Status: DC | PRN
  Administered 2018-05-17: 100 ug via INTRAVENOUS

## 2018-05-17 MED ORDER — METOCLOPRAMIDE HCL 5 MG/ML IJ SOLN: 5.0000 mg | Freq: Three times a day (TID) | INTRAMUSCULAR | Status: DC | PRN

## 2018-05-17 MED ORDER — ONDANSETRON HCL 4 MG PO TABS: 4.0000 mg | ORAL_TABLET | Freq: Four times a day (QID) | ORAL | Status: DC | PRN

## 2018-05-17 MED ORDER — CEFAZOLIN SODIUM-DEXTROSE 2-4 GM/100ML-% IV SOLN
INTRAVENOUS | Status: AC
  Filled 2018-05-17: qty 100

## 2018-05-17 MED ORDER — PROPOFOL 10 MG/ML IV BOLUS
INTRAVENOUS | Status: DC | PRN
  Administered 2018-05-17 (×3): 10 mg via INTRAVENOUS
  Administered 2018-05-17: 20 mg via INTRAVENOUS
  Administered 2018-05-17: 10 mg via INTRAVENOUS

## 2018-05-17 MED ORDER — LACTATED RINGERS IV SOLN
INTRAVENOUS | Status: DC
  Administered 2018-05-17 (×2): via INTRAVENOUS

## 2018-05-17 MED ORDER — ONDANSETRON HCL 4 MG/2ML IJ SOLN: 4.0000 mg | Freq: Once | INTRAMUSCULAR | Status: DC | PRN

## 2018-05-17 MED ORDER — MIDAZOLAM HCL 2 MG/2ML IJ SOLN
INTRAMUSCULAR | Status: AC
  Administered 2018-05-17: 0.5 mg via INTRAVENOUS
  Filled 2018-05-17: qty 2

## 2018-05-17 MED ORDER — DOCUSATE SODIUM 100 MG PO CAPS
100.0000 mg | ORAL_CAPSULE | Freq: Two times a day (BID) | ORAL | Status: DC
  Administered 2018-05-17 – 2018-05-19 (×5): 100 mg via ORAL
  Filled 2018-05-17 (×5): qty 1

## 2018-05-17 MED ORDER — TETRACAINE HCL 1 % IJ SOLN
INTRAMUSCULAR | Status: DC | PRN
  Administered 2018-05-17: 1 mg via INTRASPINAL

## 2018-05-17 MED ORDER — MIDAZOLAM HCL 2 MG/2ML IJ SOLN
0.5000 mg | Freq: Once | INTRAMUSCULAR | Status: AC
  Administered 2018-05-17: 0.5 mg via INTRAVENOUS

## 2018-05-17 MED ORDER — METRONIDAZOLE 0.75 % EX CREA
1.0000 "application " | TOPICAL_CREAM | Freq: Two times a day (BID) | CUTANEOUS | Status: DC | PRN
  Filled 2018-05-17: qty 45

## 2018-05-17 MED ORDER — PROPOFOL 500 MG/50ML IV EMUL
INTRAVENOUS | Status: AC
  Filled 2018-05-17: qty 50

## 2018-05-17 MED ORDER — PHENOL 1.4 % MT LIQD
1.0000 | OROMUCOSAL | Status: DC | PRN
  Filled 2018-05-17: qty 177

## 2018-05-17 MED ORDER — NEOMYCIN-POLYMYXIN B GU 40-200000 IR SOLN
Status: DC | PRN
  Administered 2018-05-17: 4 mL

## 2018-05-17 MED ORDER — METHOCARBAMOL 1000 MG/10ML IJ SOLN
500.0000 mg | Freq: Four times a day (QID) | INTRAVENOUS | Status: DC | PRN
  Filled 2018-05-17: qty 5

## 2018-05-17 MED ORDER — ALUM & MAG HYDROXIDE-SIMETH 200-200-20 MG/5ML PO SUSP: 30.0000 mL | ORAL | Status: DC | PRN

## 2018-05-17 MED ORDER — PROPOFOL 10 MG/ML IV BOLUS
INTRAVENOUS | Status: AC
  Filled 2018-05-17: qty 20

## 2018-05-17 MED ORDER — ONDANSETRON HCL 4 MG/2ML IJ SOLN: 4.0000 mg | Freq: Four times a day (QID) | INTRAMUSCULAR | Status: DC | PRN

## 2018-05-17 MED ORDER — METHOCARBAMOL 500 MG PO TABS
500.0000 mg | ORAL_TABLET | Freq: Four times a day (QID) | ORAL | Status: DC | PRN
  Administered 2018-05-17 – 2018-05-19 (×3): 500 mg via ORAL
  Filled 2018-05-17 (×4): qty 1

## 2018-05-17 MED ORDER — SODIUM CHLORIDE 0.9 % IV SOLN
INTRAVENOUS | Status: DC
  Administered 2018-05-17 – 2018-05-18 (×3): via INTRAVENOUS

## 2018-05-17 MED ORDER — LATANOPROST 0.005 % OP SOLN
1.0000 [drp] | Freq: Every day | OPHTHALMIC | Status: DC
  Administered 2018-05-17 – 2018-05-19 (×3): 1 [drp] via OPHTHALMIC
  Filled 2018-05-17: qty 2.5

## 2018-05-17 MED ORDER — MIDAZOLAM HCL 2 MG/2ML IJ SOLN
INTRAMUSCULAR | Status: AC
  Filled 2018-05-17: qty 2

## 2018-05-17 MED ORDER — FENTANYL CITRATE (PF) 100 MCG/2ML IJ SOLN: 25.0000 ug | INTRAMUSCULAR | Status: DC | PRN

## 2018-05-17 MED ORDER — FENTANYL CITRATE (PF) 100 MCG/2ML IJ SOLN
INTRAMUSCULAR | Status: AC
  Filled 2018-05-17: qty 2

## 2018-05-17 MED ORDER — MENTHOL 3 MG MT LOZG
1.0000 | LOZENGE | OROMUCOSAL | Status: DC | PRN
  Filled 2018-05-17: qty 9

## 2018-05-17 MED ORDER — ARTIFICIAL TEARS OPHTHALMIC OINT
1.0000 "application " | TOPICAL_OINTMENT | Freq: Every day | OPHTHALMIC | Status: DC
  Administered 2018-05-17 – 2018-05-19 (×3): 1 via OPHTHALMIC
  Filled 2018-05-17: qty 3.5

## 2018-05-17 MED ORDER — SENNOSIDES-DOCUSATE SODIUM 8.6-50 MG PO TABS: 1.0000 | ORAL_TABLET | Freq: Every evening | ORAL | Status: DC | PRN

## 2018-05-17 MED ORDER — HYDROCODONE-ACETAMINOPHEN 7.5-325 MG PO TABS: 1.0000 | ORAL_TABLET | ORAL | Status: DC | PRN

## 2018-05-17 SURGICAL SUPPLY — 59 items
BLADE SAGITTAL AGGR TOOTH XLG (BLADE) ×3 IMPLANT
BNDG COHESIVE 6X5 TAN STRL LF (GAUZE/BANDAGES/DRESSINGS) ×9 IMPLANT
CANISTER SUCT 1200ML W/VALVE (MISCELLANEOUS) ×3 IMPLANT
CHLORAPREP W/TINT 26ML (MISCELLANEOUS) ×3 IMPLANT
COVER WAND RF STERILE (DRAPES) ×3 IMPLANT
DRAPE C-ARM XRAY 36X54 (DRAPES) ×3 IMPLANT
DRAPE INCISE IOBAN 66X60 STRL (DRAPES) IMPLANT
DRAPE POUCH INSTRU U-SHP 10X18 (DRAPES) ×3 IMPLANT
DRAPE SHEET LG 3/4 BI-LAMINATE (DRAPES) ×9 IMPLANT
DRAPE TABLE BACK 80X90 (DRAPES) ×3 IMPLANT
DRESSING SURGICEL FIBRLLR 1X2 (HEMOSTASIS) ×2 IMPLANT
DRSG OPSITE POSTOP 4X8 (GAUZE/BANDAGES/DRESSINGS) ×6 IMPLANT
DRSG SURGICEL FIBRILLAR 1X2 (HEMOSTASIS) ×6
ELECT BLADE 6.5 EXT (BLADE) ×3 IMPLANT
ELECT REM PT RETURN 9FT ADLT (ELECTROSURGICAL) ×3
ELECTRODE REM PT RTRN 9FT ADLT (ELECTROSURGICAL) ×1 IMPLANT
GLOVE BIOGEL PI IND STRL 9 (GLOVE) ×1 IMPLANT
GLOVE BIOGEL PI INDICATOR 9 (GLOVE) ×2
GLOVE SURG SYN 9.0  PF PI (GLOVE) ×4
GLOVE SURG SYN 9.0 PF PI (GLOVE) ×2 IMPLANT
GOWN SRG 2XL LVL 4 RGLN SLV (GOWNS) ×1 IMPLANT
GOWN STRL NON-REIN 2XL LVL4 (GOWNS) ×3
GOWN STRL REUS W/ TWL LRG LVL3 (GOWN DISPOSABLE) ×1 IMPLANT
GOWN STRL REUS W/TWL LRG LVL3 (GOWN DISPOSABLE) ×3
HEMOVAC 400CC 10FR (MISCELLANEOUS) IMPLANT
HIP DBL LINER 54X28 (Liner) ×2 IMPLANT
HIP FEM HD S 28 (Head) ×2 IMPLANT
HOLDER FOLEY CATH W/STRAP (MISCELLANEOUS) ×3 IMPLANT
HOOD PEEL AWAY FLYTE STAYCOOL (MISCELLANEOUS) ×3 IMPLANT
KIT PREVENA INCISION MGT 13 (CANNISTER) ×3 IMPLANT
MAT ABSORB  FLUID 56X50 GRAY (MISCELLANEOUS) ×2
MAT ABSORB FLUID 56X50 GRAY (MISCELLANEOUS) ×1 IMPLANT
NDL SAFETY ECLIPSE 18X1.5 (NEEDLE) ×1 IMPLANT
NDL SPNL 18GX3.5 QUINCKE PK (NEEDLE) ×1 IMPLANT
NEEDLE HYPO 18GX1.5 SHARP (NEEDLE) ×3
NEEDLE SPNL 18GX3.5 QUINCKE PK (NEEDLE) ×3 IMPLANT
NS IRRIG 1000ML POUR BTL (IV SOLUTION) ×3 IMPLANT
PACK HIP COMPR (MISCELLANEOUS) ×3 IMPLANT
SCALPEL PROTECTED #10 DISP (BLADE) ×6 IMPLANT
SHELL ACETABULAR SZ 54 DM (Shell) ×2 IMPLANT
SOL PREP PVP 2OZ (MISCELLANEOUS) ×3
SOLUTION PREP PVP 2OZ (MISCELLANEOUS) ×1 IMPLANT
SPONGE DRAIN TRACH 4X4 STRL 2S (GAUZE/BANDAGES/DRESSINGS) ×3 IMPLANT
STAPLER SKIN PROX 35W (STAPLE) ×3 IMPLANT
STEM FEMORAL 4 STD COLLARED (Stem) ×2 IMPLANT
STRAP SAFETY 5IN WIDE (MISCELLANEOUS) ×3 IMPLANT
SUT DVC 2 QUILL PDO  T11 36X36 (SUTURE) ×2
SUT DVC 2 QUILL PDO T11 36X36 (SUTURE) ×1 IMPLANT
SUT SILK 0 (SUTURE) ×3
SUT SILK 0 30XBRD TIE 6 (SUTURE) ×1 IMPLANT
SUT V-LOC 90 ABS DVC 3-0 CL (SUTURE) ×3 IMPLANT
SUT VIC AB 1 CT1 36 (SUTURE) ×3 IMPLANT
SYR 20CC LL (SYRINGE) ×3 IMPLANT
SYR 30ML LL (SYRINGE) ×3 IMPLANT
SYR BULB IRRIG 60ML STRL (SYRINGE) ×3 IMPLANT
TAPE MICROFOAM 4IN (TAPE) ×3 IMPLANT
TOWEL OR 17X26 4PK STRL BLUE (TOWEL DISPOSABLE) ×3 IMPLANT
TRAY FOLEY MTR SLVR 16FR STAT (SET/KITS/TRAYS/PACK) ×3 IMPLANT
WND VAC CANISTER 500ML (MISCELLANEOUS) ×3 IMPLANT

## 2018-05-17 NOTE — Anesthesia Procedure Notes (Signed)
Spinal  Patient location during procedure: OR Start time: 05/17/2018 3:54 PM End time: 05/17/2018 3:59 PM Staffing Anesthesiologist: Alvin Critchley, MD Performed: anesthesiologist  Preanesthetic Checklist Completed: patient identified, site marked, surgical consent, pre-op evaluation, timeout performed, IV checked, risks and benefits discussed and monitors and equipment checked Spinal Block Patient position: sitting Prep: Betadine and site prepped and draped Patient monitoring: heart rate, cardiac monitor, continuous pulse ox and blood pressure Approach: right paramedian Location: L4-5 Injection technique: single-shot Needle Needle type: Whitacre  Needle gauge: 25 G Assessment Sensory level: T8 Additional Notes Time out called.  Patient placed in sitting position.  Back prepped and draped in sterile fashion.  A skin wheal was made in the paramedian position between L4-L5 with 1% Lidocaine plain.  A 25G Whitacre needle was advanced with the return of clear, colorless CSF in all 4 quadrants.  No blood or paresthesias.  Patient tolerated the procedure well.  14 mg of marcaine + 1 mg of Teracaine was injected.

## 2018-05-17 NOTE — H&P (Signed)
Reviewed paper H+P, will be scanned into chart. No changes noted.  

## 2018-05-17 NOTE — Anesthesia Preprocedure Evaluation (Addendum)
Anesthesia Evaluation  Patient identified by MRN, date of birth, ID band Patient awake    Reviewed: Allergy & Precautions, H&P , NPO status , Patient's Chart, lab work & pertinent test results, reviewed documented beta blocker date and time   History of Anesthesia Complications Negative for: history of anesthetic complications  Airway Mallampati: II  TM Distance: <3 FB Neck ROM: full  Mouth opening: Limited Mouth Opening  Dental no notable dental hx. (+) Upper Dentures, Edentulous Upper, Missing   Pulmonary neg pulmonary ROS, former smoker,    Pulmonary exam normal breath sounds clear to auscultation       Cardiovascular Exercise Tolerance: Good negative cardio ROS Normal cardiovascular exam Rhythm:regular Rate:Normal     Neuro/Psych  Headaches, negative psych ROS   GI/Hepatic negative GI ROS, Neg liver ROS,   Endo/Other  negative endocrine ROS  Renal/GU negative Renal ROS  negative genitourinary   Musculoskeletal  (+) Arthritis , Osteoarthritis,    Abdominal   Peds negative pediatric ROS (+)  Hematology negative hematology ROS (+) anemia ,   Anesthesia Other Findings Past Medical History:   Abnormal Pap smear of vagina                                 Glaucoma (increased eye pressure)                            Headache                                                     Multiple hemangiomas                                         Anemia                                                         Comment:pernicious   Rosacea                                                      Tumor of jaw                                                 UTI (urinary tract infection)                                Reproductive/Obstetrics negative OB ROS                             Anesthesia Physical  Anesthesia Plan  ASA: III  Anesthesia Plan: Spinal  Post-op Pain Management:    Induction:  Intravenous  PONV Risk Score and Plan:   Airway Management Planned: Nasal Cannula  Additional Equipment:   Intra-op Plan:   Post-operative Plan:   Informed Consent: I have reviewed the patients History and Physical, chart, labs and discussed the procedure including the risks, benefits and alternatives for the proposed anesthesia with the patient or authorized representative who has indicated his/her understanding and acceptance.   Dental Advisory Given and Dental advisory given  Plan Discussed with: Anesthesiologist, CRNA and Surgeon  Anesthesia Plan Comments:         Anesthesia Quick Evaluation

## 2018-05-17 NOTE — Anesthesia Post-op Follow-up Note (Signed)
Anesthesia QCDR form completed.        

## 2018-05-17 NOTE — Op Note (Signed)
05/17/2018  4:30 PM  PATIENT:  Monica Gonzales  84 y.o. female  PRE-OPERATIVE DIAGNOSIS:  PRIMARY OSTEOARTHRITIS OF RIGHT HIP  POST-OPERATIVE DIAGNOSIS:  PRIMARY OSTEOARTHRITIS OF RIGHT HIP  PROCEDURE:  Procedure(s): TOTAL HIP ARTHROPLASTY ANTERIOR APPROACH (Right)  SURGEON: Laurene Footman, MD  ASSISTANTS: None  ANESTHESIA:   spinal  EBL:  Total I/O In: -  Out: 650 [Urine:400; Blood:250]  BLOOD ADMINISTERED:none  DRAINS: (2) Hemovact drain(s) in the Subcutaneous layer with  Suction Open   LOCAL MEDICATIONS USED:  MARCAINE     SPECIMEN:  Source of Specimen:  Right femoral head  DISPOSITION OF SPECIMEN:  PATHOLOGY  COUNTS:  YES  TOURNIQUET:  * No tourniquets in log *  IMPLANTS: Medacta AMIS 4 standard stem with 54 mm Mpact DM cup with liner and metal S 28 mm head  DICTATION: .Dragon Dictation   The patient was brought to the operating room and after spinal anesthesia was obtained patient was placed on the operative table with the ipsilateral foot into the Medacta attachment, contralateral leg on a well-padded table. C-arm was brought in and preop template x-ray taken. After prepping and draping in usual sterile fashion appropriate patient identification and timeout procedures were completed. Anterior approach to the hip was obtained and centered over the greater trochanter and TFL muscle. The subcutaneous tissue was incised hemostasis being achieved by electrocautery. TFL fascia was incised and the muscle retracted laterally deep retractor placed. The lateral femoral circumflex vessels were identified and ligated. The anterior capsule was exposed and a capsulotomy performed. The neck was identified and a femoral neck cut carried out with a saw. The head was removed without difficulty and showed sclerotic femoral head and acetabulum. Reaming was carried out to 52 mm and a 54 mm cup trial gave appropriate tightness to the acetabular component a 54 DM cup was impacted into position.  The leg was then externally rotated and ischiofemoral and pubofemoral releases carried out. The femur was sequentially broached to a size 4, size 4 standard with as had trials were placed and the final components chosen. The 4 standard stem was inserted along with a metal S 28 mm head and 54 mm liner. The hip was reduced and was stable the wound was thoroughly irrigated with fibrillar placed along the posterior capsule and medial neck. The deep fascia ws closed using a heavy Quill after infiltration of 30 cc of quarter percent Sensorcaine with epinephrine.  Subcutaneous drains were then placed.  Marland Kitchen3-0 V-loc to close the skin with skin staples, incisional wound VAC applied.  patient was sent to recovery in stable condition.   PLAN OF CARE: Admit to inpatient

## 2018-05-17 NOTE — Progress Notes (Signed)
New Admit  Arrival Method: From OR Mental Orientation: A&OX4  Assessment: Pt is unable to move lower extremities and has no sensation to lower extremities bilaterally due to spinal block. Pedal pulses 2+ bilaterally. Wound vac intact. Hemovac intact. Ice to site. Skin: Surgical site intact. Skin otherwise intact. Pain: denies Safety Measures: RN oriented pt to call bell system, bed controls, bed alarm, and other high fall risk interventions. Admission: Completed 1A Orientation: Pt oriented to unit Family: Visiting, at bedside

## 2018-05-17 NOTE — NC FL2 (Signed)
  [place] LEVEL OF CARE SCREENING TOOL     IDENTIFICATION  Patient Name: Monica Gonzales Birthdate: February 25, 1934 Sex: female Admission Date (Current Location): 05/17/2018  [place] and [place] Number:  Engineering geologist and Address:  [facility], [address] [place], [place] [address]      Provider Number: [id number]  Attending Physician Name and Address:  Hessie Knows, MD  Relative Name and Phone Number:       Current Level of Care: Hospital Recommended Level of Care: [place] Prior Approval Number:    Date Approved/Denied:   PASRR Number: [id number])  Discharge Plan: SNF    Current Diagnoses: Patient Active Problem List   Diagnosis Date Noted  . Status post total hip replacement, right 05/17/2018    Orientation RESPIRATION BLADDER Height & Weight     Self, Situation, Place, Time  Normal Continent Weight: 156 lb (70.8 kg) Height:  5\' 1"  (154.9 cm)  BEHAVIORAL SYMPTOMS/MOOD NEUROLOGICAL BOWEL NUTRITION STATUS      Continent Diet(Diet: Regular )  AMBULATORY STATUS COMMUNICATION OF NEEDS Skin   Extensive Assist Verbally Surgical wounds, Wound Vac(Incision: Right Hip, Provena Wound Vac. )                       Personal Care Assistance Level of Assistance  Bathing, Feeding, Dressing Bathing Assistance: Limited assistance Feeding assistance: Independent Dressing Assistance: Limited assistance     Functional Limitations Info  Sight, Hearing, Speech Sight Info: Adequate Hearing Info: Adequate Speech Info: Adequate    SPECIAL CARE FACTORS FREQUENCY  PT (By licensed PT), OT (By licensed OT)     PT Frequency: (5) OT Frequency: (5)            Contractures      Additional Factors Info  Code Status, Allergies Code Status Info: (not on file. ) Allergies Info: (Prednisone, Pyridium Phenazopyridine Hcl, Sulfa Antibiotics)           Current Medications (05/17/2018):  This is the current  hospital active medication list Current Facility-Administered Medications  Medication Dose Route Frequency Provider Last Rate Last Dose  . famotidine (PEPCID) 20 MG tablet           . fentaNYL (SUBLIMAZE) injection 25 mcg  25 mcg Intravenous Q5 min PRN Alvin Critchley, MD      . lactated ringers infusion   Intravenous Continuous Martha Clan, MD 75 mL/hr at 05/17/18 1314    . ondansetron (ZOFRAN) injection 4 mg  4 mg Intravenous Once PRN Alvin Critchley, MD      . tranexamic acid (CYKLOKAPRON) IVPB 1,000 mg  1,000 mg Intravenous To OR Hessie Knows, MD         Discharge Medications: Please see discharge summary for a list of discharge medications.  Relevant Imaging Results:  Relevant Lab Results:   Additional Information (SSN: [id number])  Hadi Dubin, Veronia Beets, LCSW

## 2018-05-17 NOTE — Transfer of Care (Signed)
Immediate Anesthesia Transfer of Care Note  Patient: Monica Gonzales  Procedure(s) Performed: TOTAL HIP ARTHROPLASTY ANTERIOR APPROACH (Right Hip)  Patient Location: PACU  Anesthesia Type:General  Level of Consciousness: sedated  Airway & Oxygen Therapy: Patient Spontanous Breathing and Patient connected to face mask oxygen  Post-op Assessment: Report given to RN and Post -op Vital signs reviewed and stable  Post vital signs: Reviewed and stable  Last Vitals:  Vitals Value Taken Time  BP 171/138 05/17/2018  4:35 PM  Temp 36.3 C 05/17/2018  4:33 PM  Pulse 104 05/17/2018  4:36 PM  Resp 23 05/17/2018  4:36 PM  SpO2 99 % 05/17/2018  4:36 PM  Vitals shown include unvalidated device data.  Last Pain:  Vitals:   05/17/18 1301  TempSrc: Temporal  PainSc: 0-No pain         Complications: No apparent anesthesia complications

## 2018-05-18 ENCOUNTER — Encounter: Payer: Self-pay | Admitting: Orthopedic Surgery

## 2018-05-18 LAB — BASIC METABOLIC PANEL
ANION GAP: 7 (ref 5–15)
BUN: 7 mg/dL — ABNORMAL LOW (ref 8–23)
CO2: 26 mmol/L (ref 22–32)
Calcium: 8.1 mg/dL — ABNORMAL LOW (ref 8.9–10.3)
Chloride: 107 mmol/L (ref 98–111)
Creatinine, Ser: 0.66 mg/dL (ref 0.44–1.00)
GFR calc Af Amer: >60 mL/min (ref >60)
GLUCOSE: 117 mg/dL — AB (ref 70–99)
Potassium: 4.1 mmol/L (ref 3.5–5.1)
Sodium: 140 mmol/L (ref 135–145)

## 2018-05-18 LAB — CBC
HEMATOCRIT: 34.5 % — AB (ref 36.0–46.0)
HEMOGLOBIN: 11.3 g/dL — AB (ref 12.0–15.0)
MCH: 31.7 pg (ref 26.0–34.0)
MCHC: 32.8 g/dL (ref 30.0–36.0)
MCV: 96.9 fL (ref 80.0–100.0)
NRBC: 0 % (ref 0.0–0.2)
PLATELETS: 138 10*3/uL — AB (ref 150–400)
RBC: 3.56 MIL/uL — ABNORMAL LOW (ref 3.87–5.11)
RDW: 11.9 % (ref 11.5–15.5)
WBC: 7.2 10*3/uL (ref 4.0–10.5)

## 2018-05-18 MED ORDER — MAGNESIUM HYDROXIDE 400 MG/5ML PO SUSP
30.0000 mL | Freq: Every day | ORAL | Status: DC
  Administered 2018-05-18 – 2018-05-19 (×2): 30 mL via ORAL
  Filled 2018-05-18 (×2): qty 30

## 2018-05-18 NOTE — Evaluation (Signed)
Occupational Therapy Evaluation Patient Details Name: Monica Gonzales MRN: [id number] DOB: [birth year] Today's Date: 05/18/2018    History of Present Illness Pt is an 84 yo female diagnosed with OA of the right hip and is s/p elective right THA.  PMH includes arthritis and anemia.   Clinical Impression   Pt seen for OT evaluation this date, POD#1 from above surgery. Pt was independent in all ADLs prior to surgery, however occasionally using SPC for mobility due to R hip pain. Pt is eager to return to PLOF with less pain and improved safety and independence. Pt currently requires minimal assist for LB dressing and bathing while in seated position due to pain and limited AROM of R hip. Pt instructed in self care skills, falls prevention strategies, home/routines modifications, DME/AE for LB bathing and dressing tasks, and compression stocking mgt strategies. Pt verbalized understanding of all education/training presented at this time.  Pt would benefit from additional instruction in self care skills and techniques to help maintain precautions with or without assistive devices to support recall and carryover prior to discharge. Recommend HHOT upon discharge.     Follow Up Recommendations  Home health OT    Equipment Recommendations  3 in 1 bedside commode    Recommendations for Other Services       Precautions / Restrictions Precautions Precautions: Anterior Hip;Fall Restrictions Weight Bearing Restrictions: Yes RLE Weight Bearing: Weight bearing as tolerated      Mobility Bed Mobility Overal bed mobility: Needs Assistance Bed Mobility: Sit to Supine     Supine to sit: Min assist;Mod assist Sit to supine: Mod assist   General bed mobility comments: Min/Mod A for RLE into bed.   Transfers Overall transfer level: Needs assistance Equipment used: Rolling walker (2 wheeled) Transfers: Sit to/from Stand Sit to Stand: Min guard             Balance Overall balance  assessment: Needs assistance Sitting-balance support: No upper extremity supported;Feet supported Sitting balance-Leahy Scale: Fair     Standing balance support: Bilateral upper extremity supported Standing balance-Leahy Scale: Poor                             ADL either performed or assessed with clinical judgement   ADL Overall ADL's : Needs assistance/impaired Eating/Feeding: Independent   Grooming: Supervision/safety;Standing   Upper Body Bathing: Supervision/ safety;Sitting   Lower Body Bathing: Sitting/lateral leans;Minimal assistance   Upper Body Dressing : Supervision/safety;Sitting   Lower Body Dressing: Minimal assistance;Sit to/from stand   Toilet Transfer: Min guard;RW;Ambulation;BSC           Functional mobility during ADLs: Min guard;Rolling walker       Vision Baseline Vision/History: Wears glasses Wears Glasses: At all times Patient Visual Report: No change from baseline       Perception     Praxis      Pertinent Vitals/Pain Pain Assessment: 0-10 Pain Score: 3  Pain Location: R hip Pain Descriptors / Indicators: (prickily pain) Pain Intervention(s): Limited activity within patient's tolerance;Monitored during session;Repositioned     Hand Dominance     Extremity/Trunk Assessment Upper Extremity Assessment Upper Extremity Assessment: Overall WFL for tasks assessed   Lower Extremity Assessment Lower Extremity Assessment: Defer to PT evaluation       Communication Communication Communication: No difficulties   Cognition Arousal/Alertness: Awake/alert Behavior During Therapy: WFL for tasks assessed/performed Overall Cognitive Status: Within Functional Limits for tasks assessed  General Comments       Exercises   Other Exercises: Pt educated in compression sock mgt including donning/doffing, wear schedule and positioning Other Exercises: Pt educated in use of AE for  LB dressing  Other Exercises: Pt educated in home modifications to increase safety during ADL/IADL tasks.    Shoulder Instructions      Home Living Family/patient expects to be discharged to:: Private residence Living Arrangements: Spouse/significant other Available Help at Discharge: Available 24 hours/day Type of Home: House Home Access: Stairs to enter [employer] of Steps: 3 Entrance Stairs-Rails: Left Home Layout: One level     Bathroom Shower/Tub: Occupational psychologist: [place]: Toilet riser;Walker - 2 wheels;Cane - single point          Prior Functioning/Environment Level of Independence: Independent        Comments: Pt Ind with amb without AD with no fall history, Ind with ADLs, enjoys reading and watching tv        OT Problem List: Decreased strength;Impaired balance (sitting and/or standing);Pain;Decreased range of motion;Decreased knowledge of use of DME or AE      OT Treatment/Interventions: Self-care/ADL training;DME and/or AE instruction;Therapeutic activities;Therapeutic exercise;Patient/family education    OT Goals(Current goals can be found in the care plan section) Acute Rehab OT Goals Patient Stated Goal: to get back home OT Goal Formulation: With patient Time For Goal Achievement: 06/01/18 Potential to Achieve Goals: Good ADL Goals Pt Will Perform Lower Body Dressing: with supervision;with adaptive equipment;sit to/from stand Additional ADL Goal #1: Pt will independently educate caregiver on compression sock mgt including donning/doffing, wear schedule and positioning.  OT Frequency: Min 1X/week   Barriers to D/C:            Co-evaluation              AM-PAC PT "6 Clicks" Daily Activity     Outcome Measure Help from another person eating meals?: None Help from another person taking care of personal grooming?: None Help from another person toileting, which includes using toliet, bedpan, or  urinal?: A Little Help from another person bathing (including washing, rinsing, drying)?: A Little Help from another person to put on and taking off regular upper body clothing?: None Help from another person to put on and taking off regular lower body clothing?: A Little 6 Click Score: 21   End of Session Equipment Utilized During Treatment: Gait belt;Rolling walker  Activity Tolerance: Patient tolerated treatment well Patient left: in bed;with call bell/phone within reach;with bed alarm set;with SCD's reapplied  OT Visit Diagnosis: Other abnormalities of gait and mobility (R26.89);Pain Pain - Right/Left: Right Pain - part of body: Hip                Time: 6295-2841 OT Time Calculation (min): 32 min Charges:     Jadene Pierini OTS  05/18/2018, 5:58 PM

## 2018-05-18 NOTE — Clinical Social Work Note (Signed)
Clinical Social Work Assessment  Patient Details  Name: Monica Gonzales MRN: [id number] Date of Birth: 09/07/1933  Date of referral:  05/18/18               Reason for consult:  Facility Placement                Permission sought to share information with:    Permission granted to share information::     Name::        Agency::     Relationship::     Contact Information:     Housing/Transportation Living arrangements for the past 2 months:  Single Family Home Source of Information:  Patient Patient Interpreter Needed:  None Criminal Activity/Legal Involvement Pertinent to Current Situation/Hospitalization:  No - Comment as needed Significant Relationships:  Spouse Lives with:  Spouse Do you feel safe going back to the place where you live?  Yes Need for family participation in patient care:  Yes (Comment)  Care giving concerns:  Patient lives in [place] with her husband Monica Gonzales.    Social Worker assessment / plan:  Clinical Social Worker (CSW) received SNF consult. PT is recommending SNF. CSW met with patient alone at bedside to discuss D/C plan. Patient was alert and oriented X4 and was sitting up in the chair at bedside. CSW introduced self and explained role of CSW department. Per patient she lives in [place] with her husband Monica Gonzales. Per patient her husband had a stroke and has memory impairments. CSW explained that PT is recommending SNF. Patient adamantly refused SNF and stated that she is going home with home health. CSW explained the benefits of SNF and the difference between SNF and home health. Patient continued to refuse SNF and stated that she is going home. Per patient she does not want to leave her husband at home alone for a long period of time. CSW provided emotional support. RN case manager aware of above. CSW will continue to follow and assist as needed.   Employment status:  Retired Insurance information:  Medicare PT Recommendations:  Skilled Nursing  Facility Information / Referral to community resources:  Other (Comment Required)(Patient refused SNF. )  Patient/Family's Response to care:  Patient refused SNF.   Patient/Family's Understanding of and Emotional Response to Diagnosis, Current Treatment, and Prognosis:  Patient was very pleasant and thanked CSW for assistance.   Emotional Assessment Appearance:  Appears stated age Attitude/Demeanor/Rapport:    Affect (typically observed):  Accepting, Adaptable, Pleasant Orientation:  Oriented to Self, Oriented to Place, Oriented to  Time, Oriented to Situation Alcohol / Substance use:  Not Applicable Psych involvement (Current and /or in the community):  No (Comment)  Discharge Needs  Concerns to be addressed:  Discharge Planning Concerns Readmission within the last 30 days:  No Current discharge risk:  Dependent with Mobility Barriers to Discharge:  Continued Medical Work up   Sample, Bailey M, LCSW 05/18/2018, 3:09 PM  

## 2018-05-18 NOTE — Evaluation (Signed)
Physical Therapy Evaluation Patient Details Name: Monica Gonzales MRN: [id number] DOB: Jul 03, 1934 Today's Date: 05/18/2018   History of Present Illness  Pt is an 84 yo female diagnosed with OA of the right hip and is s/p elective right THA.  PMH includes arthritis and anemia.    Clinical Impression  Pt presents with deficits in strength, transfers, mobility, gait, balance, and activity tolerance.  Pt required extensive assistance with bed mobility tasks for RLE in and out of bed and for trunk to full upright position.  Pt required CGA and cues for sequencing to stand from the EOB in an elevated position.  Pt was antalgic in the RLE in standing and required training in step-to pattern gait with the RW in order to tolerate taking steps.  Pt ambulated a max of around 3' but was able to do so three times with each session somewhat improved from the one before it.  Overall pt presents with significant deficits in functional mobility and would not be safe to return to her prior living situation at this time.  Pt will benefit from PT services in a SNF setting upon discharge to safely address above deficits for decreased caregiver assistance and eventual return to PLOF.      Follow Up Recommendations SNF    Equipment Recommendations  None recommended by PT    Recommendations for Other Services       Precautions / Restrictions Precautions Precautions: Anterior Hip;Fall Restrictions Weight Bearing Restrictions: Yes RLE Weight Bearing: Weight bearing as tolerated      Mobility  Bed Mobility Overal bed mobility: Needs Assistance Bed Mobility: Supine to Sit;Sit to Supine     Supine to sit: Mod assist Sit to supine: Mod assist   General bed mobility comments: Mod A for RLE in and out of bed and for trunk to full upright position.    Transfers Overall transfer level: Needs assistance Equipment used: Rolling walker (2 wheeled) Transfers: Sit to/from Stand Sit to Stand: Min guard          General transfer comment: Mod verbal cues for proper hand placement and increased trunk flex  Ambulation/Gait Ambulation/Gait assistance: Min guard Gait Distance (Feet): 3 Feet x 3 with seated therapeutic rest breaks between sessions Assistive device: Rolling walker (2 wheeled) Gait Pattern/deviations: Step-to pattern;Antalgic;Trunk flexed;Decreased stance time - right Gait velocity: Decreased   General Gait Details: Pt required cues for step-to sequencing for decreased pain with RLE WB in order to take several very small steps at the EOB.    Stairs            Wheelchair Mobility    Modified Rankin (Stroke Patients Only)       Balance Overall balance assessment: Mild deficits observed, not formally tested                                           Pertinent Vitals/Pain Pain Assessment: 0-10 Pain Score: 3  Pain Location: R hip Pain Descriptors / Indicators: Sore;Aching Pain Intervention(s): Limited activity within patient's tolerance;Monitored during session;Premedicated before session    Home Living Family/patient expects to be discharged to:: Private residence Living Arrangements: Spouse/significant other Available Help at Discharge: Available 24 hours/day Type of Home: House Home Access: Stairs to enter Entrance Stairs-Rails: Left Entrance Stairs-Number of Steps: 3 Home Layout: One level Home Equipment: Toilet riser;Walker - 2 wheels;Cane - single point  Prior Function Level of Independence: Independent         Comments: Pt Ind with amb without AD with no fall history, Ind with ADLs, community ambulator     Hand Dominance        Extremity/Trunk Assessment        Lower Extremity Assessment Lower Extremity Assessment: Generalized weakness;RLE deficits/detail RLE: Unable to fully assess due to pain RLE Sensation: WNL       Communication   Communication: No difficulties  Cognition Arousal/Alertness:  Awake/alert Behavior During Therapy: WFL for tasks assessed/performed Overall Cognitive Status: Within Functional Limits for tasks assessed                                        General Comments      Exercises Total Joint Exercises Ankle Circles/Pumps: AROM;Both;5 reps;10 reps Quad Sets: Both;5 reps;10 reps;Strengthening Gluteal Sets: Strengthening;Both;5 reps;10 reps Hip ABduction/ADduction: AAROM;Both;AROM;10 reps Long Arc Quad: AROM;Both;10 reps;15 reps Knee Flexion: AROM;Both;10 reps;15 reps Other Exercises Other Exercises: HEP education/review for BLE APs, QS, and GS x 10 each every 1-2 hours   Assessment/Plan    PT Assessment Patient needs continued PT services  PT Problem List Decreased strength;Decreased activity tolerance;Decreased balance;Decreased knowledge of use of DME;Decreased mobility       PT Treatment Interventions DME instruction;Gait training;Stair training;Functional mobility training;Balance training;Therapeutic exercise;Therapeutic activities;Patient/family education    PT Goals (Current goals can be found in the Care Plan section)  Acute Rehab PT Goals Patient Stated Goal: To walk better PT Goal Formulation: With patient Time For Goal Achievement: 05/31/18 Potential to Achieve Goals: Good    Frequency BID   Barriers to discharge Inaccessible home environment      Co-evaluation               AM-PAC PT "6 Clicks" Daily Activity  Outcome Measure Difficulty turning over in bed (including adjusting bedclothes, sheets and blankets)?: Unable Difficulty moving from lying on back to sitting on the side of the bed? : Unable Difficulty sitting down on and standing up from a chair with arms (e.g., wheelchair, bedside commode, etc,.)?: Unable Help needed moving to and from a bed to chair (including a wheelchair)?: A Lot Help needed walking in hospital room?: Total Help needed climbing 3-5 steps with a railing? : Total 6 Click  Score: 7    End of Session Equipment Utilized During Treatment: Gait belt Activity Tolerance: Patient limited by pain Patient left: in bed;with bed alarm set;with call bell/phone within reach;with SCD's reapplied(Pt declined up in chair) Nurse Communication: Mobility status PT Visit Diagnosis: Muscle weakness (generalized) (M62.81);Other abnormalities of gait and mobility (R26.89)    Time: 8115-7262 PT Time Calculation (min) (ACUTE ONLY): 44 min   Charges:   PT Evaluation $PT Eval Low Complexity: 1 Low PT Treatments $Gait Training: 8-22 mins $Therapeutic Exercise: 8-22 mins        D. Scott Brandis Matsuura PT, DPT 05/18/18, 1:05 PM

## 2018-05-18 NOTE — Progress Notes (Signed)
Physical Therapy Treatment Patient Details Name: Monica Gonzales MRN: [id number] DOB: Jun 17, 1934 Today's Date: 05/18/2018    History of Present Illness Pt is an 84 yo female diagnosed with OA of the right hip and is s/p elective right THA.  PMH includes arthritis and anemia.    PT Comments    Pt presents with deficits in strength, transfers, mobility, gait, balance, and activity tolerance but is slowly progressing towards goals.  Pt continues to require mod A with bed mobility tasks but demonstrated good effort.  Pt required verbal cues for sequencing with transfers but no physical assistance during the session.  Pt somewhat anxious regarding amb attempt with encouragement/education of benefits provided.  Pt able to amb 20' this session with a RW with heavy use of BUEs on the RW that improved somewhat with cues.  Pt's gait was antalgic but progressed to a step-through pattern but with decreased RLE stance time compared to the left.  Pt will benefit from PT services in a SNF setting upon discharge to safely address above deficits for decreased caregiver assistance and eventual return to PLOF.       Follow Up Recommendations  SNF     Equipment Recommendations  None recommended by PT    Recommendations for Other Services       Precautions / Restrictions Precautions Precautions: Anterior Hip;Fall Restrictions Weight Bearing Restrictions: Yes RLE Weight Bearing: Weight bearing as tolerated    Mobility  Bed Mobility Overal bed mobility: Needs Assistance Bed Mobility: Supine to Sit;Sit to Supine     Supine to sit: Mod assist Sit to supine: Mod assist   General bed mobility comments: Mod A for RLE in and out of bed and for trunk to full upright position.    Transfers Overall transfer level: Needs assistance Equipment used: Rolling walker (2 wheeled) Transfers: Sit to/from Stand Sit to Stand: Min guard         General transfer comment: Mod verbal cues for proper hand  placement and increased trunk flex  Ambulation/Gait Ambulation/Gait assistance: Min guard Gait Distance (Feet): 20 Feet Assistive device: Rolling walker (2 wheeled) Gait Pattern/deviations: Step-through pattern;Decreased step length - left;Decreased stance time - right;Antalgic Gait velocity: Decreased   General Gait Details: Improved amb distance with encouragement this session but overall activity tolerance remains limited.   Stairs             Wheelchair Mobility    Modified Rankin (Stroke Patients Only)       Balance Overall balance assessment: Mild deficits observed, not formally tested                                          Cognition Arousal/Alertness: Awake/alert Behavior During Therapy: WFL for tasks assessed/performed Overall Cognitive Status: Within Functional Limits for tasks assessed                                        Exercises Total Joint Exercises Ankle Circles/Pumps: AROM;Both;5 reps;10 reps Quad Sets: Both;5 reps;10 reps;Strengthening Gluteal Sets: Strengthening;Both;5 reps;10 reps Towel Squeeze: Strengthening;Both;10 reps Hip ABduction/ADduction: AAROM;Right;5 reps;10 reps;Other (comment)(Gentle, low amplitude) Straight Leg Raises: AAROM;Right;10 reps;5 reps;Other (comment)(Gentle, low amplitude) Long Arc Quad: AROM;Both;10 reps;15 reps Knee Flexion: AROM;Both;10 reps;15 reps Marching in Standing: AROM;Both;5 reps Other Exercises Other Exercises: HEP education/review for BLE  APs, QS, and GS x 10 each every 1-2 hours Other Exercises: Physiological benefits of activity, joint movement, and being out of the bed provided    General Comments        Pertinent Vitals/Pain Pain Assessment: 0-10 Pain Score: 3  Pain Location: R hip Pain Descriptors / Indicators: Sore;Aching Pain Intervention(s): Premedicated before session;Monitored during session;Limited activity within patient's tolerance    Home Living  Family/patient expects to be discharged to:: Private residence Living Arrangements: Spouse/significant other Available Help at Discharge: Available 24 hours/day Type of Home: House Home Access: Stairs to enter Entrance Stairs-Rails: Left Home Layout: One level Home Equipment: Toilet riser;Walker - 2 wheels;Cane - single point      Prior Function Level of Independence: Independent      Comments: Pt Ind with amb without AD with no fall history, Ind with ADLs, community ambulator   PT Goals (current goals can now be found in the care plan section) Acute Rehab PT Goals Patient Stated Goal: To walk better PT Goal Formulation: With patient Time For Goal Achievement: 05/31/18 Potential to Achieve Goals: Good Progress towards PT goals: Progressing toward goals    Frequency    BID      PT Plan Current plan remains appropriate    Co-evaluation              AM-PAC PT "6 Clicks" Daily Activity  Outcome Measure  Difficulty turning over in bed (including adjusting bedclothes, sheets and blankets)?: Unable Difficulty moving from lying on back to sitting on the side of the bed? : Unable Difficulty sitting down on and standing up from a chair with arms (e.g., wheelchair, bedside commode, etc,.)?: Unable Help needed moving to and from a bed to chair (including a wheelchair)?: A Lot Help needed walking in hospital room?: Total Help needed climbing 3-5 steps with a railing? : Total 6 Click Score: 7    End of Session Equipment Utilized During Treatment: Gait belt Activity Tolerance: Patient limited by pain Patient left: with bed alarm set;with SCD's reapplied;in chair;with chair alarm set Nurse Communication: Mobility status PT Visit Diagnosis: Muscle weakness (generalized) (M62.81);Other abnormalities of gait and mobility (R26.89)     Time: 0315-9458 PT Time Calculation (min) (ACUTE ONLY): 41 min  Charges:  $Gait Training: 8-22 mins $Therapeutic Exercise: 23-37 mins                      D. Scott Kiyah Demartini PT, DPT 05/18/18, 3:35 PM

## 2018-05-18 NOTE — Progress Notes (Signed)
   Subjective: 1 Day Post-Op Procedure(s) (LRB): TOTAL HIP ARTHROPLASTY ANTERIOR APPROACH (Right) Patient reports pain as mild.   Patient is well, and has had no acute complaints or problems Denies any CP, SOB, ABD pain. We will continue therapy today.  Plan is to go Home after hospital stay.  Objective: Vital signs in last 24 hours: Temp:  [97 F (36.1 C)-98.6 F (37 C)] 98.6 F (37 C) (11/06 0721) Pulse Rate:  [58-104] 78 (11/06 0721) Resp:  [13-21] 18 (11/06 0721) BP: (109-160)/(54-105) 125/57 (11/06 0721) SpO2:  [92 %-100 %] 100 % (11/06 0721) Weight:  [70.8 kg] 70.8 kg (11/05 1301)  Intake/Output from previous day: 11/05 0701 - 11/06 0700 In: 2400.5 [P.O.:480; I.V.:1847.9; IV Piggyback:72.6] Out: 0569 [Urine:1305; Blood:250] Intake/Output this shift: No intake/output data recorded.  Recent Labs    05/18/18 0353  HGB 11.3*   Recent Labs    05/18/18 0353  WBC 7.2  RBC 3.56*  HCT 34.5*  PLT 138*   Recent Labs    05/18/18 0353  NA 140  K 4.1  CL 107  CO2 26  BUN 7*  CREATININE 0.66  GLUCOSE 117*  CALCIUM 8.1*   No results for input(s): LABPT, INR in the last 72 hours.  EXAM General - Patient is Alert, Appropriate and Oriented Extremity - Neurovascular intact Sensation intact distally Intact pulses distally Dorsiflexion/Plantar flexion intact No cellulitis present Compartment soft Dressing - dressing C/D/I, Hemovac and wound VAC intact Motor Function - intact, moving foot and toes well on exam.   Past Medical History:  Diagnosis Date  . Abnormal Pap smear of vagina   . Anemia    pernicious  . Arthritis   . Diverticulosis   . Glaucoma (increased eye pressure)   . Headache   . Multiple hemangiomas    OF THE LIVER  . Pernicious anemia   . Rosacea   . Tumor of jaw   . UTI (urinary tract infection)     Assessment/Plan:   1 Day Post-Op Procedure(s) (LRB): TOTAL HIP ARTHROPLASTY ANTERIOR APPROACH (Right) Active Problems:   Status post  total hip replacement, right  Estimated body mass index is 29.48 kg/m as calculated from the following:   Height as of this encounter: 5\' 1"  (1.549 m).   Weight as of this encounter: 70.8 kg. Advance diet Up with therapy  Needs bowel movement Pain well controlled Labs stable, will recheck labs in the morning Vital signs are stable Care management to assist with discharge to home with home health PT  DVT Prophylaxis - Aspirin, TED hose and SCDs Weight-Bearing as tolerated to right leg   T. Rachelle Hora, PA-C [place] 05/18/2018, 7:51 AM

## 2018-05-18 NOTE — Anesthesia Postprocedure Evaluation (Signed)
Anesthesia Post Note  Patient: Monica Gonzales  Procedure(s) Performed: TOTAL HIP ARTHROPLASTY ANTERIOR APPROACH (Right Hip)  Patient location during evaluation: Nursing Unit Anesthesia Type: Spinal Level of consciousness: oriented and awake and alert Pain management: pain level controlled Vital Signs Assessment: post-procedure vital signs reviewed and stable Respiratory status: spontaneous breathing, respiratory function stable and patient connected to nasal cannula oxygen Cardiovascular status: blood pressure returned to baseline and stable Postop Assessment: no headache, no backache and no apparent nausea or vomiting Anesthetic complications: no     Last Vitals:  Vitals:   05/18/18 0107 05/18/18 0721  BP: (!) 131/54 (!) 125/57  Pulse: 75 78  Resp: 16 18  Temp: 36.9 C 37 C  SpO2: 98% 100%    Last Pain:  Vitals:   05/18/18 0721  TempSrc: Oral  PainSc:                  Estill Batten

## 2018-05-19 LAB — BASIC METABOLIC PANEL
Anion gap: 6 (ref 5–15)
BUN: 9 mg/dL (ref 8–23)
CALCIUM: 8 mg/dL — AB (ref 8.9–10.3)
CHLORIDE: 104 mmol/L (ref 98–111)
CO2: 26 mmol/L (ref 22–32)
CREATININE: 0.74 mg/dL (ref 0.44–1.00)
GFR calc Af Amer: >60 mL/min (ref >60)
GFR calc non Af Amer: >60 mL/min (ref >60)
Glucose, Bld: 104 mg/dL — ABNORMAL HIGH (ref 70–99)
Potassium: 4.2 mmol/L (ref 3.5–5.1)
SODIUM: 136 mmol/L (ref 135–145)

## 2018-05-19 LAB — CBC
HCT: 34.4 % — ABNORMAL LOW (ref 36.0–46.0)
HEMOGLOBIN: 11.2 g/dL — AB (ref 12.0–15.0)
MCH: 31.8 pg (ref 26.0–34.0)
MCHC: 32.6 g/dL (ref 30.0–36.0)
MCV: 97.7 fL (ref 80.0–100.0)
Platelets: 133 10*3/uL — ABNORMAL LOW (ref 150–400)
RBC: 3.52 MIL/uL — ABNORMAL LOW (ref 3.87–5.11)
RDW: 12.2 % (ref 11.5–15.5)
WBC: 8.7 10*3/uL (ref 4.0–10.5)
nRBC: 0 % (ref 0.0–0.2)

## 2018-05-19 LAB — SURGICAL PATHOLOGY

## 2018-05-19 MED ORDER — ASPIRIN 81 MG PO CHEW: 81.0000 mg | CHEWABLE_TABLET | Freq: Two times a day (BID) | ORAL | 0 refills | Status: AC

## 2018-05-19 MED ORDER — BISACODYL 5 MG PO TBEC: 5.0000 mg | DELAYED_RELEASE_TABLET | Freq: Every day | ORAL | 0 refills | Status: DC | PRN

## 2018-05-19 MED ORDER — ASPIRIN 81 MG PO CHEW: 81.0000 mg | CHEWABLE_TABLET | Freq: Two times a day (BID) | ORAL | 0 refills | Status: DC

## 2018-05-19 MED ORDER — ACETAMINOPHEN 500 MG PO TABS: 500.0000 mg | ORAL_TABLET | Freq: Four times a day (QID) | ORAL | Status: AC | PRN

## 2018-05-19 MED ORDER — TRAMADOL HCL 50 MG PO TABS: 50.0000 mg | ORAL_TABLET | Freq: Four times a day (QID) | ORAL | 0 refills | Status: DC

## 2018-05-19 NOTE — Discharge Summary (Addendum)
Physician Discharge Summary  Patient ID: Monica Gonzales MRN: [id number] DOB/AGE: 08-01-1933 84 y.o.  Admit date: 05/17/2018 Discharge date: 05/20/2018 Admission Diagnoses:  PRIMARY OSTEOARTHRITIS OF RIGHT HIP   Discharge Diagnoses: Patient Active Problem List   Diagnosis Date Noted  . Status post total hip replacement, right 05/17/2018    Past Medical History:  Diagnosis Date  . Abnormal Pap smear of vagina   . Anemia    pernicious  . Arthritis   . Diverticulosis   . Glaucoma (increased eye pressure)   . Headache   . Multiple hemangiomas    OF THE LIVER  . Pernicious anemia   . Rosacea   . Tumor of jaw   . UTI (urinary tract infection)      Transfusion: none   Consultants (if any):   Discharged Condition: Improved  Hospital Course: Monica Gonzales is an 84 y.o. female who was admitted 05/17/2018 with a diagnosis of right hip osteoarthritis and went to the operating room on 05/17/2018 and underwent the above named procedures.    Surgeries: Procedure(s): TOTAL HIP ARTHROPLASTY ANTERIOR APPROACH on 05/17/2018 Patient tolerated the surgery well. Taken to PACU where she was stabilized and then transferred to the orthopedic floor.  Started on aspirin 81 mg twice daily, teds, SCDs. Foot pumps applied bilaterally at 80 mm. Heels elevated on bed with rolled towels. No evidence of DVT. Negative Homan. Physical therapy started on day #1 for gait training and transfer. OT started day #1 for ADL and assisted devices.  Patient's foley was d/c on day #1. Patient's IV and hemovac was d/c on day #2.  On post op day #3 patient was stable and ready for discharge to home with home health PT.  Implants: Medacta AMIS 4 standard stem with 54 mm Mpact DM cup with liner and metal S 28 mm head  She was given perioperative antibiotics:  Anti-infectives (From admission, onward)   Start     Dose/Rate Route Frequency Ordered Stop   05/17/18 2100  ceFAZolin (ANCEF) IVPB 1 g/50 mL premix     1 g 100 mL/hr over 30 Minutes Intravenous Every 6 hours 05/17/18 1829 05/18/18 0727   05/17/18 1235  ceFAZolin (ANCEF) 2-4 GM/100ML-% IVPB    Note to Pharmacy:  Lyman Bishop   : cabinet override      05/17/18 1235 05/17/18 1530   05/16/18 2145  ceFAZolin (ANCEF) IVPB 2g/100 mL premix     2 g 200 mL/hr over 30 Minutes Intravenous  Once 05/16/18 2142 05/17/18 1520    .  She was given sequential compression devices, early ambulation, and aspirin, teds for DVT prophylaxis.  She benefited maximally from the hospital stay and there were no complications.    Recent vital signs:  Vitals:   05/19/18 1628 05/19/18 2325  BP: 135/62 128/60  Pulse: 88 (!) 111  Resp:  18  Temp: 98.3 F (36.8 C) 99 F (37.2 C)  SpO2: 94% 97%    Recent laboratory studies:  Lab Results  Component Value Date   HGB 11.2 (L) 05/19/2018   HGB 11.3 (L) 05/18/2018   HGB 14.1 05/10/2018   Lab Results  Component Value Date   WBC 8.7 05/19/2018   PLT 133 (L) 05/19/2018   Lab Results  Component Value Date   INR 0.95 05/10/2018   Lab Results  Component Value Date   NA 136 05/19/2018   K 4.2 05/19/2018   CL 104 05/19/2018   CO2 26 05/19/2018   BUN 9  05/19/2018   CREATININE 0.74 05/19/2018   GLUCOSE 104 (H) 05/19/2018    Discharge Medications:   Allergies as of 05/20/2018      Reactions   Prednisone Other (See Comments)   Severe migraine, all steroids   Pyridium [phenazopyridine Hcl] Other (See Comments)   Migraine headache   Sulfa Antibiotics Nausea Only   Can take if eats first      Medication List    STOP taking these medications   ibuprofen 200 MG tablet Commonly known as:  ADVIL,MOTRIN     TAKE these medications   acetaminophen 500 MG tablet Commonly known as:  TYLENOL Take 1-2 tablets (500-1,000 mg total) by mouth every 6 (six) hours as needed for mild pain (pain score 1-3 or temp > 100.5).   aspirin 81 MG chewable tablet Chew 1 tablet (81 mg total) by mouth 2 (two) times  daily.   bisacodyl 5 MG EC tablet Commonly known as:  DULCOLAX Take 1 tablet (5 mg total) by mouth daily as needed for moderate constipation.   CITRUCEL oral powder Generic drug:  methylcellulose Take 1 packet by mouth daily.   cyanocobalamin 1000 MCG/ML injection Commonly known as:  (VITAMIN B-12) Inject 1,000 mcg into the muscle every 30 (thirty) days.   fluocinonide cream 0.05 % Commonly known as:  LIDEX Apply 1 application topically 2 (two) times daily as needed (for break out).   latanoprost 0.005 % ophthalmic solution Commonly known as:  XALATAN Place 1 drop into both eyes at bedtime.   LUBRICANT EYE Oint Apply 1 application to eye at bedtime. Left eye   metroNIDAZOLE 0.75 % cream Commonly known as:  METROCREAM Apply 1 application topically 2 (two) times daily as needed (for rosacea).   traMADol 50 MG tablet Commonly known as:  ULTRAM Take 1 tablet (50 mg total) by mouth every 6 (six) hours.            Durable Medical Equipment  (From admission, onward)         Start     Ordered   05/17/18 1830  DME Walker rolling  Once    Question:  Patient needs a walker to treat with the following condition  Answer:  Status post total hip replacement, right   05/17/18 1829   05/17/18 1830  DME 3 n 1  Once     05/17/18 1829   05/17/18 1830  DME Bedside commode  Once    Question:  Patient needs a bedside commode to treat with the following condition  Answer:  Status post total hip replacement, right   05/17/18 1829          Diagnostic Studies: Dg Hip Operative Unilat W Or W/o Pelvis Right  Result Date: 05/17/2018 CLINICAL DATA:  84 year old female. Right hip replacement. Initial encounter. EXAM: OPERATIVE right HIP (WITH PELVIS IF PERFORMED) 3 VIEWS TECHNIQUE: Fluoroscopic spot image(s) were submitted for interpretation post-operatively. Fluoroscopic time: 24 seconds. COMPARISON:  None. FINDINGS: Three intraoperative images submitted for review after surgery. Proximal  aspect of total right hip replacement visualized. The acetabular component abuts the superior acetabulum although separate from the medial aspect of the acetabulum. This may represent expected changes. Inferior aspect of prosthesis not visualized. IMPRESSION: Post total right hip replacement as noted above. Electronically Signed   By: Genia Del M.D.   On: 05/17/2018 16:37   Dg Hip Unilat W Or W/o Pelvis 2-3 Views Right  Result Date: 05/17/2018 CLINICAL DATA:  Right hip replacement EXAM: DG HIP (WITH  OR WITHOUT PELVIS) 2-3V RIGHT COMPARISON:  None. FINDINGS: Status post right hip replacement with normal alignment. No fracture is seen. Surgical drain in the right hip soft tissues with cutaneous staples and small amount of soft tissue gas. IMPRESSION: Status post right hip replacement with expected surgical changes Electronically Signed   By: Donavan Foil M.D.   On: 05/17/2018 17:24    Disposition:     Follow-up Information    Duanne Guess, PA-C Follow up in 2 week(s).   Specialties:  Orthopedic Surgery, Emergency Medicine Contact information: [place] [address] [phone]            Signed: Feliberto Gottron 05/20/2018, 7:36 AM

## 2018-05-19 NOTE — Discharge Instructions (Signed)

## 2018-05-19 NOTE — Progress Notes (Signed)
Physical Therapy Treatment Patient Details Name: Monica Gonzales MRN: [id number] DOB: July 08, 1934 Today's Date: 05/19/2018    History of Present Illness Pt is an 84 yo female diagnosed with OA of the right hip and is s/p elective right THA.  PMH includes arthritis and anemia.    PT Comments    Pt reports general fatigue but agrees to gait.  Light assist for bed mobility skills to manage RLE.  Stood and was able to ambulate around nursing unit with min guard.  Speed varied during gait with education to slow down at times and for general safety.  Voiced understanding.  Declined further activity at this time.   Follow Up Recommendations  Home health PT;Supervision for mobility/OOB     Equipment Recommendations  None recommended by PT    Recommendations for Other Services       Precautions / Restrictions Precautions Precautions: Anterior Hip;Fall Restrictions Weight Bearing Restrictions: Yes RLE Weight Bearing: Weight bearing as tolerated    Mobility  Bed Mobility Overal bed mobility: Needs Assistance Bed Mobility: Sit to Supine;Supine to Sit     Supine to sit: Min guard Sit to supine: Min guard   General bed mobility comments: light assist for LE management  Transfers Overall transfer level: Needs assistance Equipment used: Rolling walker (2 wheeled) Transfers: Sit to/from Stand Sit to Stand: Min guard            Ambulation/Gait   Gait Distance (Feet): 180 Feet Assistive device: Rolling walker (2 wheeled) Gait Pattern/deviations: Step-to pattern;Step-through pattern;Decreased step length - right;Decreased step length - left Gait velocity: Decreased   General Gait Details: verbal cues to slow gait to increase safety.   Stairs             Wheelchair Mobility    Modified Rankin (Stroke Patients Only)       Balance Overall balance assessment: Needs assistance Sitting-balance support: No upper extremity supported;Feet supported Sitting  balance-Leahy Scale: Good     Standing balance support: Bilateral upper extremity supported Standing balance-Leahy Scale: Fair                              Cognition Arousal/Alertness: Awake/alert Behavior During Therapy: WFL for tasks assessed/performed Overall Cognitive Status: Within Functional Limits for tasks assessed                                        Exercises      General Comments        Pertinent Vitals/Pain Pain Assessment: 0-10 Pain Score: 3  Pain Location: R hip Pain Descriptors / Indicators: Aching;Sore;Operative site guarding Pain Intervention(s): Limited activity within patient's tolerance;Monitored during session    Home Living                      Prior Function            PT Goals (current goals can now be found in the care plan section) Progress towards PT goals: Progressing toward goals    Frequency    BID      PT Plan Current plan remains appropriate    Co-evaluation              AM-PAC PT "6 Clicks" Daily Activity  Outcome Measure  Difficulty turning over in bed (including adjusting bedclothes, sheets and blankets)?: A Little  Difficulty moving from lying on back to sitting on the side of the bed? : A Little Difficulty sitting down on and standing up from a chair with arms (e.g., wheelchair, bedside commode, etc,.)?: A Little Help needed moving to and from a bed to chair (including a wheelchair)?: A Little Help needed walking in hospital room?: A Little Help needed climbing 3-5 steps with a railing? : A Little 6 Click Score: 18    End of Session Equipment Utilized During Treatment: Gait belt Activity Tolerance: Patient tolerated treatment well Patient left: with bed alarm set;with SCD's reapplied;in bed;with call bell/phone within reach         Time: 2423-5361 PT Time Calculation (min) (ACUTE ONLY): 13 min  Charges:  $Gait Training: 8-22 mins                     Chesley Noon, PTA 05/19/18, 2:49 PM

## 2018-05-19 NOTE — Progress Notes (Signed)
   Subjective: 2 Days Post-Op Procedure(s) (LRB): TOTAL HIP ARTHROPLASTY ANTERIOR APPROACH (Right) Patient reports pain as mild.   Patient is well, and has had no acute complaints or problems Denies any CP, SOB, ABD pain. We will continue therapy today.  Plan is to go Home after hospital stay.  Objective: Vital signs in last 24 hours: Temp:  [98 F (36.7 C)-99.5 F (37.5 C)] 99.5 F (37.5 C) (11/07 0802) Pulse Rate:  [80-85] 83 (11/07 0802) Resp:  [18] 18 (11/06 2328) BP: (94-138)/(51-79) 123/51 (11/07 0802) SpO2:  [95 %-100 %] 100 % (11/07 0802)  Intake/Output from previous day: 11/06 0701 - 11/07 0700 In: 1522.1 [P.O.:240; I.V.:1254.6; IV Piggyback:27.4] Out: 50 [Drains:50] Intake/Output this shift: No intake/output data recorded.  Recent Labs    05/18/18 0353 05/19/18 0412  HGB 11.3* 11.2*   Recent Labs    05/18/18 0353 05/19/18 0412  WBC 7.2 8.7  RBC 3.56* 3.52*  HCT 34.5* 34.4*  PLT 138* 133*   Recent Labs    05/18/18 0353 05/19/18 0412  NA 140 136  K 4.1 4.2  CL 107 104  CO2 26 26  BUN 7* 9  CREATININE 0.66 0.74  GLUCOSE 117* 104*  CALCIUM 8.1* 8.0*   No results for input(s): LABPT, INR in the last 72 hours.  EXAM General - Patient is Alert, Appropriate and Oriented Extremity - Neurovascular intact Sensation intact distally Intact pulses distally Dorsiflexion/Plantar flexion intact No cellulitis present Compartment soft Dressing - dressing C/D/I, Hemovac removed. Wound vac intact without drainage Motor Function - intact, moving foot and toes well on exam.   Past Medical History:  Diagnosis Date  . Abnormal Pap smear of vagina   . Anemia    pernicious  . Arthritis   . Diverticulosis   . Glaucoma (increased eye pressure)   . Headache   . Multiple hemangiomas    OF THE LIVER  . Pernicious anemia   . Rosacea   . Tumor of jaw   . UTI (urinary tract infection)     Assessment/Plan:   2 Days Post-Op Procedure(s) (LRB): TOTAL HIP  ARTHROPLASTY ANTERIOR APPROACH (Right) Active Problems:   Status post total hip replacement, right  Estimated body mass index is 29.48 kg/m as calculated from the following:   Height as of this encounter: 5\' 1"  (1.549 m).   Weight as of this encounter: 70.8 kg. Advance diet Up with therapy  Needs bowel movement Pain well controlled Labs stableng Vital signs are stable Care management to assist with discharge to home with home health PT.   DVT Prophylaxis - Aspirin, TED hose and SCDs Weight-Bearing as tolerated to right leg   T. Rachelle Hora, PA-C Monica Gonzales 05/19/2018, 9:05 AM

## 2018-05-19 NOTE — Progress Notes (Signed)
Physical Therapy Treatment Patient Details Name: Monica Gonzales MRN: [id number] DOB: 23-Oct-1933 Today's Date: 05/19/2018    History of Present Illness Pt is an 84 yo female diagnosed with OA of the right hip and is s/p elective right THA.  PMH includes arthritis and anemia.    PT Comments    Pt presents with deficits in strength, transfers, mobility, gait, balance, and activity tolerance but made excellent progress towards goals this session.  Pt was able to perform sup to sit without physical assistance and only required min A to get her RLE back into bed during sit to sup.  Pt stood from the EOB with much improved confidence and speed with good stability.  Pt was able to amb 2 x 100 feet with a RW with improved cadence but remains antalgic on the RLE with decreased RLE stance time and corresponding LLE step length.  Pt was steady ascending and descending two steps with B rails with pt stating that she can enter the front of the house where there are 4 steps with B narrow rails to hold.  Pt's discharge recommendation updated to HHPT from SNF to reflect pt's progress towards goals and significant improvement in functional mobility.      Follow Up Recommendations  Home health PT;Supervision for mobility/OOB     Equipment Recommendations  None recommended by PT    Recommendations for Other Services       Precautions / Restrictions Precautions Precautions: Anterior Hip;Fall Restrictions Weight Bearing Restrictions: Yes RLE Weight Bearing: Weight bearing as tolerated    Mobility  Bed Mobility Overal bed mobility: Needs Assistance Bed Mobility: Sit to Supine;Supine to Sit     Supine to sit: Supervision Sit to supine: Min assist   General bed mobility comments: Pt able to perform sup to sit with extra time and effort but without physical assist this session but continued to require assist to get her RLE back into bed  Transfers Overall transfer level: Needs assistance Equipment  used: Rolling walker (2 wheeled) Transfers: Sit to/from Stand Sit to Stand: Min guard         General transfer comment: Min verbal cues for proper hand placement   Ambulation/Gait Ambulation/Gait assistance: Min guard Gait Distance (Feet): 100 Feet x 2 Assistive device: Rolling walker (2 wheeled) Gait Pattern/deviations: Step-through pattern;Antalgic;Decreased stance time - right Gait velocity: Decreased   General Gait Details: Improved amb distance and cadence during amb this session but remains antalgic on the RLE with decreased RLE stance time   Stairs Stairs: Yes Stairs assistance: Min guard Stair Management: Two rails Number of Stairs: 2(Pt reports has alternate entry into home with narrow B rails) General stair comments: Min verbal cues for sequencing with pt demonstrating fair eccentric and concentric control with B rails   Wheelchair Mobility    Modified Rankin (Stroke Patients Only)       Balance Overall balance assessment: Needs assistance Sitting-balance support: No upper extremity supported;Feet supported Sitting balance-Leahy Scale: Good     Standing balance support: Bilateral upper extremity supported Standing balance-Leahy Scale: Fair                              Cognition Arousal/Alertness: Awake/alert Behavior During Therapy: WFL for tasks assessed/performed Overall Cognitive Status: Within Functional Limits for tasks assessed  Exercises Total Joint Exercises Ankle Circles/Pumps: Strengthening;10 reps;15 reps Quad Sets: Strengthening;Both;10 reps;15 reps Gluteal Sets: Strengthening;Both;10 reps;15 reps Heel Slides: Strengthening;AAROM;10 reps;5 reps;Other (comment)(AAROM on the RLE) Straight Leg Raises: AAROM;Right;10 reps;5 reps;AROM(AAROM on the RLE) Long Arc Quad: AROM;Both;10 reps Knee Flexion: AROM;Both;10 reps Marching in Standing: AROM;Both;10 reps;Standing Other  Exercises Other Exercises: HEP education/review for BLE APs, QS, and GS x 10 each every 1-2 hours Other Exercises: Standing RLE SLR and hip abd x 10 with low amplitude    General Comments        Pertinent Vitals/Pain Pain Assessment: 0-10 Pain Score: 3  Pain Location: R hip Pain Descriptors / Indicators: Aching;Sore;Operative site guarding Pain Intervention(s): Premedicated before session;Monitored during session;Limited activity within patient's tolerance    Home Living                      Prior Function            PT Goals (current goals can now be found in the care plan section) Progress towards PT goals: Progressing toward goals    Frequency    BID      PT Plan Discharge plan needs to be updated    Co-evaluation              AM-PAC PT "6 Clicks" Daily Activity  Outcome Measure                   End of Session Equipment Utilized During Treatment: Gait belt Activity Tolerance: Patient tolerated treatment well Patient left: with bed alarm set;with SCD's reapplied;in bed;with call bell/phone within reach(Pt declined up in chair) Nurse Communication: Mobility status PT Visit Diagnosis: Muscle weakness (generalized) (M62.81);Other abnormalities of gait and mobility (R26.89)     Time: 5027-7412 PT Time Calculation (min) (ACUTE ONLY): 39 min  Charges:  $Gait Training: 23-37 mins $Therapeutic Exercise: 8-22 mins                     D. Scott Demetrius Mahler PT, DPT 05/19/18, 10:51 AM

## 2018-05-20 MED ORDER — METOPROLOL TARTRATE 25 MG PO TABS: 25.0000 mg | ORAL_TABLET | Freq: Two times a day (BID) | ORAL | 0 refills | Status: DC

## 2018-05-20 MED ORDER — METOPROLOL TARTRATE 25 MG PO TABS
25.0000 mg | ORAL_TABLET | Freq: Two times a day (BID) | ORAL | Status: DC
  Administered 2018-05-20: 25 mg via ORAL
  Filled 2018-05-20: qty 1

## 2018-05-20 NOTE — Care Management Note (Signed)
Case Management Note  Patient Details  Name: Monica Gonzales MRN: [id number] Date of Birth: 1934-05-12  Subjective/Objective:   Patient to be discharged per MD order. Orders in place for home health services. Patient agreeable to home health. Per patient preference will place referral with [facility] for PT services. Patient has a walker, BSC and cane in the home. No DME needs. Spouse to transport.                   Action/Plan:   Expected Discharge Date:  05/20/18               Expected Discharge Plan:  [place]  In-House Referral:     Discharge planning Services  CM Consult  Post Acute Care Choice:  Home Health Choice offered to:  Patient  DME Arranged:    DME Agency:     HH Arranged:  PT [place]:  [place]  Status of Service:  Completed, signed off  If discussed at [place] of Stay Meetings, dates discussed:    Additional Comments:  Latanya Maudlin, RN 05/20/2018, 3:43 PM

## 2018-05-20 NOTE — Progress Notes (Signed)
Physical Therapy Treatment Patient Details Name: Monica Gonzales MRN: [id number] DOB: 1933-09-22 Today's Date: 05/20/2018    History of Present Illness Pt is an 84 yo female diagnosed with OA of the right hip and is s/p elective right THA.  PMH includes arthritis and anemia.    PT Comments    Pt presents with deficits in strength, transfers, mobility, gait, balance, and activity tolerance.  Pt continues to require only light assistance for her RLE into bed during bed mobility tasks.  Pt was SBA with transfers with good control and stability.  Pt was able to amb 1 x 30' and 1 x 125' with a RW and CGA with good stability and improving RLE stance time.  Pt's SpO2 on room air WNL throughout the session.  Pt's baseline resting HR 84 bpm, 86 bpm during supine therex, and 108 bpm after amb with no adverse symptoms reported.  Pt will benefit from HHPT services upon discharge to safely address above deficits for decreased caregiver assistance and eventual return to PLOF.    Follow Up Recommendations  Home health PT;Supervision for mobility/OOB     Equipment Recommendations  None recommended by PT    Recommendations for Other Services       Precautions / Restrictions Precautions Precautions: Anterior Hip;Fall Restrictions Weight Bearing Restrictions: Yes RLE Weight Bearing: Weight bearing as tolerated    Mobility  Bed Mobility Overal bed mobility: Needs Assistance Bed Mobility: Sit to Supine;Supine to Sit     Supine to sit: Supervision Sit to supine: Min assist   General bed mobility comments: light assist for RLE into bed during sit to sup  Transfers Overall transfer level: Needs assistance Equipment used: Rolling walker (2 wheeled) Transfers: Sit to/from Stand Sit to Stand: Supervision         General transfer comment: Good control and stability during transfers with good carryover regarding proper hand placement  Ambulation/Gait Ambulation/Gait assistance: Min  guard Gait Distance (Feet): 30 Feet x 1, 125 Feet x 1 Assistive device: Rolling walker (2 wheeled) Gait Pattern/deviations: Step-through pattern;Decreased stance time - right;Antalgic     General Gait Details: Mildly antalgic gait pattern on the RLE but with improving RLE stance time/LLE step length   Stairs             Wheelchair Mobility    Modified Rankin (Stroke Patients Only)       Balance Overall balance assessment: Needs assistance Sitting-balance support: No upper extremity supported;Feet supported Sitting balance-Leahy Scale: Good     Standing balance support: Bilateral upper extremity supported Standing balance-Leahy Scale: Good                              Cognition Arousal/Alertness: Awake/alert Behavior During Therapy: WFL for tasks assessed/performed Overall Cognitive Status: Within Functional Limits for tasks assessed                                        Exercises Total Joint Exercises Ankle Circles/Pumps: Strengthening;10 reps;15 reps Quad Sets: Strengthening;Both;10 reps;15 reps Gluteal Sets: Strengthening;Both;10 reps;15 reps Towel Squeeze: Strengthening;Both;10 reps Heel Slides: AAROM;5 reps;Right Hip ABduction/ADduction: AAROM;Right;5 reps;10 reps;Other (comment)(Low amplitude) Long Arc Quad: AROM;Both;10 reps;15 reps Knee Flexion: AROM;Both;10 reps;15 reps Marching in Standing: AROM;Both;10 reps;Standing Other Exercises Other Exercises: HEP review for BLE APs, QS, and GS x 10 each every 1-2 hours  General Comments        Pertinent Vitals/Pain Pain Assessment: 0-10 Pain Score: 4  Pain Location: R hip Pain Descriptors / Indicators: Aching;Sore;Operative site guarding Pain Intervention(s): Premedicated before session;Monitored during session    Home Living                      Prior Function            PT Goals (current goals can now be found in the care plan section) Progress towards  PT goals: Progressing toward goals    Frequency    BID      PT Plan Current plan remains appropriate    Co-evaluation              AM-PAC PT "6 Clicks" Daily Activity  Outcome Measure                   End of Session Equipment Utilized During Treatment: Gait belt Activity Tolerance: Patient tolerated treatment well Patient left: in bed;with bed alarm set;with call bell/phone within reach;Other (comment)(SCDs not in room, removed by nursing per pt secondary to being soiled; pt declined up in chair) Nurse Communication: Mobility status;Other (comment)(Pt's HR with activity) PT Visit Diagnosis: Muscle weakness (generalized) (M62.81);Other abnormalities of gait and mobility (R26.89)     Time: 1505-6979 PT Time Calculation (min) (ACUTE ONLY): 26 min  Charges:  $Gait Training: 8-22 mins $Therapeutic Exercise: 8-22 mins                     D. Scott Teigen Parslow PT, DPT 05/20/18, 12:23 PM

## 2018-05-20 NOTE — Progress Notes (Signed)
Physical Therapy Treatment Patient Details Name: Monica Gonzales MRN: [id number] DOB: [birth year] Today's Date: 05/20/2018    History of Present Illness Pt is an 84 yo female diagnosed with OA of the right hip and is s/p elective right THA.  PMH includes arthritis and anemia.    PT Comments    Pt presents with deficits in strength, transfers, mobility, gait, balance, and activity tolerance but continues to progress towards goals.  Pt continues to require only min A to get her RLE into bed during sit to sup but can perform sup to sit without assistance.  Pt was able to amb 2 x 125' with good control and stability with her HR increasing from the mid 90s at rest to the low 100s after amb.  Pt was steady ascending and descending 4 steps with B rails with min verbal cues and good carryover for proper sequencing.  Pt will benefit from HHPT services upon discharge to safely address above deficits for decreased caregiver assistance and eventual return to PLOF.     Follow Up Recommendations  Home health PT;Supervision for mobility/OOB     Equipment Recommendations  None recommended by PT    Recommendations for Other Services       Precautions / Restrictions Precautions Precautions: Anterior Hip;Fall Restrictions Weight Bearing Restrictions: Yes RLE Weight Bearing: Weight bearing as tolerated    Mobility  Bed Mobility Overal bed mobility: Needs Assistance Bed Mobility: Sit to Supine;Supine to Sit     Supine to sit: Supervision Sit to supine: Min assist   General bed mobility comments: light assist for RLE into bed during sit to sup  Transfers Overall transfer level: Needs assistance Equipment used: Rolling walker (2 wheeled) Transfers: Sit to/from Stand Sit to Stand: Supervision         General transfer comment: Good control and stability during transfers with good carryover regarding proper hand placement  Ambulation/Gait Ambulation/Gait assistance: Min guard Gait  Distance (Feet): 125 Feet x 2 Assistive device: Rolling walker (2 wheeled) Gait Pattern/deviations: Step-through pattern;Decreased stance time - right;Antalgic Gait velocity: Decreased   General Gait Details: Mildly antalgic gait pattern on the RLE but with improving RLE stance time/LLE step length   Stairs Stairs: Yes Stairs assistance: Min guard Stair Management: Two rails Number of Stairs: 4 General stair comments: Min verbal cues for sequencing with pt demonstrating fair eccentric and concentric control with B rails   Wheelchair Mobility    Modified Rankin (Stroke Patients Only)       Balance Overall balance assessment: Needs assistance Sitting-balance support: No upper extremity supported;Feet supported Sitting balance-Leahy Scale: Good     Standing balance support: Bilateral upper extremity supported Standing balance-Leahy Scale: Good                              Cognition Arousal/Alertness: Awake/alert Behavior During Therapy: WFL for tasks assessed/performed Overall Cognitive Status: Within Functional Limits for tasks assessed                                        Exercises Total Joint Exercises Ankle Circles/Pumps: Strengthening;10 reps;15 reps Quad Sets: Strengthening;Both;10 reps;15 reps Gluteal Sets: Strengthening;Both;10 reps;15 reps    General Comments        Pertinent Vitals/Pain Pain Assessment: 0-10 Pain Score: 4  Pain Location: R hip Pain Descriptors / Indicators: Aching;Sore;Operative site guarding  Pain Intervention(s): Premedicated before session;Monitored during session    Home Living                      Prior Function            PT Goals (current goals can now be found in the care plan section) Progress towards PT goals: Progressing toward goals    Frequency    BID      PT Plan Current plan remains appropriate    Co-evaluation              AM-PAC PT "6 Clicks" Daily  Activity  Outcome Measure                   End of Session Equipment Utilized During Treatment: Gait belt Activity Tolerance: Patient tolerated treatment well Patient left: in bed;with bed alarm set;with call bell/phone within reach;Other (comment)(Pt declined up in chair) Nurse Communication: Mobility status PT Visit Diagnosis: Muscle weakness (generalized) (M62.81);Other abnormalities of gait and mobility (R26.89)     Time: 3276-1470 PT Time Calculation (min) (ACUTE ONLY): 25 min  Charges:  $Gait Training: 23-37 mins                     D. Scott Darwin Rothlisberger PT, DPT 05/20/18, 4:58 PM

## 2018-05-20 NOTE — Progress Notes (Signed)
Pt has irregular HR that goes to 120 bpm at 8 AM . MD made aware. New order for metoprolol. HR now 88.

## 2018-05-20 NOTE — Care Management Important Message (Signed)
Important Message  Patient Details  Name: Monica Gonzales MRN: [id number] Date of Birth: August 05, 1933   Medicare Important Message Given:  Yes    [name] 05/20/2018, 10:51 AM

## 2018-05-20 NOTE — Progress Notes (Signed)
Pt. Discharged to home via family vehicle. Discharge instructions and medication regimen reviewed at bedside with patient. Pt. verbalizes understanding of instructions and medication regimen. Prescriptions in packet. Patient assessment unchanged from this morning. IV discontinued per policy.

## 2018-05-20 NOTE — Progress Notes (Addendum)
   Subjective: 3 Days Post-Op Procedure(s) (LRB): TOTAL HIP ARTHROPLASTY ANTERIOR APPROACH (Right) Patient reports pain as mild.  Pain located in the right groin. Patient is well, and has had no acute complaints or problems Denies any lower extremity pain, swelling, CP, SOB, ABD pain. We will continue therapy today.  Plan is to go Home after hospital stay.  Objective: Vital signs in last 24 hours: Temp:  [98.3 F (36.8 C)-99.5 F (37.5 C)] 99 F (37.2 C) (11/07 2325) Pulse Rate:  [83-111] 111 (11/07 2325) Resp:  [18] 18 (11/07 2325) BP: (123-135)/(51-62) 128/60 (11/07 2325) SpO2:  [94 %-100 %] 97 % (11/07 2325)  Intake/Output from previous day: 11/07 0701 - 11/08 0700 In: 1821.1 [P.O.:720; I.V.:1101.1] Out: 0  Intake/Output this shift: No intake/output data recorded.  Recent Labs    05/18/18 0353 05/19/18 0412  HGB 11.3* 11.2*   Recent Labs    05/18/18 0353 05/19/18 0412  WBC 7.2 8.7  RBC 3.56* 3.52*  HCT 34.5* 34.4*  PLT 138* 133*   Recent Labs    05/18/18 0353 05/19/18 0412  NA 140 136  K 4.1 4.2  CL 107 104  CO2 26 26  BUN 7* 9  CREATININE 0.66 0.74  GLUCOSE 117* 104*  CALCIUM 8.1* 8.0*   No results for input(s): LABPT, INR in the last 72 hours.  EXAM General - Patient is Alert, Appropriate and Oriented Extremity - Neurovascular intact Sensation intact distally Intact pulses distally Dorsiflexion/Plantar flexion intact No cellulitis present Compartment soft Dressing - dressing C/D/I, wound VAC is intact.  Motor Function - intact, moving foot and toes well on exam.   Past Medical History:  Diagnosis Date  . Abnormal Pap smear of vagina   . Anemia    pernicious  . Arthritis   . Diverticulosis   . Glaucoma (increased eye pressure)   . Headache   . Multiple hemangiomas    OF THE LIVER  . Pernicious anemia   . Rosacea   . Tumor of jaw   . UTI (urinary tract infection)     Assessment/Plan:   3 Days Post-Op Procedure(s) (LRB): TOTAL  HIP ARTHROPLASTY ANTERIOR APPROACH (Right) Active Problems:   Status post total hip replacement, right  Estimated body mass index is 29.48 kg/m as calculated from the following:   Height as of this encounter: 5\' 1"  (1.549 m).   Weight as of this encounter: 70.8 kg. Advance diet Up with therapy  Patient doing well this morning.  Pain controlled.   No complaints. HR noted to be in 120s. Will start metoprolol 25 mg BID. Continue to monitor HR today. Follow-up with [facility] orthopedics in 2 weeks DVT Prophylaxis - Aspirin, TED hose and SCDs Weight-Bearing as tolerated to right leg   T. Rachelle Hora, PA-C [facility] 05/20/2018, 7:34 AM

## 2018-05-20 NOTE — Progress Notes (Signed)
Clinical Education officer, museum (CSW) met with patient to confirm that she still wants to D/C home with home health. Patient reported that she will likely D/C home tomorrow and wants home health. RN case manager aware of above. Please reconsult if future social work needs arise. CSW signing off.   [employer], LCSW [phone]

## 2018-05-27 ENCOUNTER — Telehealth: Payer: Self-pay

## 2018-05-27 NOTE — Telephone Encounter (Signed)
EMMI Follow-up: Monica Gonzales received a automated call and was checking to see why we had called.  I talked with Monica Gonzales and explained our process of two automated calls post discharge and that was the first call.  Said she was doing fine, husband and grandchildren preparing meals and having HH PT three times a week.  Said she had gotten her Rx's filled and was aware of follow-up appointments so no needs noted today. Thanked me for calling back.

## 2018-06-02 ENCOUNTER — Other Ambulatory Visit: Payer: Self-pay

## 2018-06-02 ENCOUNTER — Emergency Department
Admission: EM | Admit: 2018-06-02 | Discharge: 2018-06-02 | Disposition: A | Discharge status: Home / Self Care | Payer: Medicare Other | Attending: Emergency Medicine | Admitting: Emergency Medicine

## 2018-06-02 ENCOUNTER — Emergency Department: Payer: Medicare Other

## 2018-06-02 DIAGNOSIS — S0990XA Unspecified injury of head, initial encounter: Secondary | ICD-10-CM | POA: Diagnosis present

## 2018-06-02 DIAGNOSIS — Y9301 Activity, walking, marching and hiking: Secondary | ICD-10-CM | POA: Diagnosis not present

## 2018-06-02 DIAGNOSIS — Y929 Unspecified place or not applicable: Secondary | ICD-10-CM | POA: Insufficient documentation

## 2018-06-02 DIAGNOSIS — Z79899 Other long term (current) drug therapy: Secondary | ICD-10-CM | POA: Diagnosis not present

## 2018-06-02 DIAGNOSIS — W0110XA Fall on same level from slipping, tripping and stumbling with subsequent striking against unspecified object, initial encounter: Secondary | ICD-10-CM | POA: Insufficient documentation

## 2018-06-02 DIAGNOSIS — Y999 Unspecified external cause status: Secondary | ICD-10-CM | POA: Insufficient documentation

## 2018-06-02 DIAGNOSIS — Z96641 Presence of right artificial hip joint: Secondary | ICD-10-CM | POA: Insufficient documentation

## 2018-06-02 DIAGNOSIS — Z87891 Personal history of nicotine dependence: Secondary | ICD-10-CM | POA: Diagnosis not present

## 2018-06-02 NOTE — ED Notes (Signed)
Pt is going to medical imaging.   

## 2018-06-02 NOTE — ED Provider Notes (Addendum)
[facility] Emergency Department Provider Note   First MD Initiated Contact with Patient 06/02/18 0143     (approximate)  I have reviewed the triage vital signs and the nursing notes.   HISTORY  Chief Complaint Fall    HPI Monica Gonzales is a 84 y.o. female left below list of chronic medical condition to the emergency part of follow accidental fall. Patient states that she was attempting to go to the bathroom when her "walker got caught up". Patient states she subsequent fell striking her forehead of consciousness. Patient denies any discomfort at present. Patient denies any difficulty breathing.patient is not taking any anticoagulation.   Past Medical History:  Diagnosis Date  . Abnormal Pap smear of vagina   . Anemia    pernicious  . Arthritis   . Diverticulosis   . Glaucoma (increased eye pressure)   . Headache   . Multiple hemangiomas    OF THE LIVER  . Pernicious anemia   . Rosacea   . Tumor of jaw   . UTI (urinary tract infection)     Patient Active Problem List   Diagnosis Date Noted  . Status post total hip replacement, right 05/17/2018    Past Surgical History:  Procedure Laterality Date  . ABDOMINAL HYSTERECTOMY    . BUNIONECTOMY Left    screw in place  . CATARACT EXTRACTION, BILATERAL  2005  . COLONOSCOPY WITH PROPOFOL N/A 09/02/2015   Procedure: COLONOSCOPY WITH PROPOFOL;  Surgeon: Lollie Sails, MD;  Location[facility] END[facility]COPY;  Service: Endoscopy;  Laterality: N/A;  . EYE SURGERY    . jaw tumor resection Left 2012  . OOPHORECTOMY    . TONSILLECTOMY    . TOTAL HIP ARTHROPLASTY Right 05/17/2018   Procedure: TOTAL HIP ARTHROPLASTY ANTERIOR APPROACH;  Surgeon: Hessie Knows, MD;  Location: [facility] ORS;  Service: Orthopedics;  Laterality: Right;    Prior to Admission medications   Medication Sig Start Date End Date Taking? Authorizing Provider  acetaminophen (TYLENOL) 500 MG tablet Take 1-2 tablets (500-1,000 mg total) by  mouth every 6 (six) hours as needed for mild pain (pain score 1-3 or temp > 100.5). 05/19/18   Duanne Guess, PA-C  aspirin 81 MG chewable tablet Chew 1 tablet (81 mg total) by mouth 2 (two) times daily. 05/19/18 06/18/18  Duanne Guess, PA-C  bisacodyl (DULCOLAX) 5 MG EC tablet Take 1 tablet (5 mg total) by mouth daily as needed for moderate constipation. 05/19/18   Duanne Guess, PA-C  cyanocobalamin (,VITAMIN B-12,) 1000 MCG/ML injection Inject 1,000 mcg into the muscle every 30 (thirty) days.    [provider]  fluocinonide cream (LIDEX) 6.19 % Apply 1 application topically 2 (two) times daily as needed (for break out).     [provider]  latanoprost (XALATAN) 0.005 % ophthalmic solution Place 1 drop into both eyes at bedtime.    [provider]  methylcellulose (CITRUCEL) oral powder Take 1 packet by mouth daily.    [provider]  metoprolol tartrate (LOPRESSOR) 25 MG tablet Take 1 tablet (25 mg total) by mouth 2 (two) times daily. 05/20/18   Duanne Guess, PA-C  metroNIDAZOLE (METROCREAM) 0.75 % cream Apply 1 application topically 2 (two) times daily as needed (for rosacea).     [provider]  traMADol (ULTRAM) 50 MG tablet Take 1 tablet (50 mg total) by mouth every 6 (six) hours. 05/19/18   Duanne Guess, PA-C  White Petrolatum-Mineral Oil (LUBRICANT  EYE) OINT Apply 1 application to eye at bedtime. Left eye    [provider]    Allergies Prednisone; Pyridium [phenazopyridine hcl]; and Sulfa antibiotics  Family History  Problem Relation Age of Onset  . Ovarian cancer Mother   . Diabetes Father   . Alcohol abuse Father     Social History Social History   Tobacco Use  . Smoking status: Former Smoker    Types: Cigarettes    Last attempt to quit: 1960    Years since quitting: 59.9  . Smokeless tobacco: Never Used  Substance Use Topics  . Alcohol use: No  . Drug use: No    Review of Systems Constitutional:  No fever/chills Eyes: No visual changes. ENT: No sore throat. Cardiovascular: Denies chest pain. Respiratory: Denies shortness of breath. Gastrointestinal: No abdominal pain.  No nausea, no vomiting.  No diarrhea.  No constipation. Genitourinary: Negative for dysuria. Musculoskeletal: Negative for neck pain.  Negative for back pain. Integumentary: Negative for rash. Neurological: Negative for headaches, focal weakness or numbness.   ____________________________________________   PHYSICAL EXAM:  VITAL SIGNS: ED Triage Vitals  Enc Vitals Group     BP 06/02/18 0131 (!) 146/108     Pulse Rate 06/02/18 0131 87     Resp 06/02/18 0131 16     Temp 06/02/18 0131 97.7 F (36.5 C)     Temp Source 06/02/18 0131 Oral     SpO2 06/02/18 0131 98 %     Weight 06/02/18 0136 70.3 kg (155 lb)     Height 06/02/18 0136 1.549 m (5\' 1" )     Head Circumference --      Peak Flow --      Pain Score 06/02/18 0135 0     Pain Loc --      Pain Edu? --      Excl. in [place]? --     Constitutional: Alert and oriented. Well appearing and in no acute distress. Eyes: Conjunctivae are normal. PERRL. EOMI. Head:forehead contusion noted. Mouth/Throat: Mucous membranes are moist.  Oropharynx non-erythematous. Neck: No stridor.   Cardiovascular: Normal rate, regular rhythm. Good peripheral circulation. Grossly normal heart sounds. Respiratory: Normal respiratory effort.  No retractions. Lungs CTAB. Gastrointestinal: Soft and nontender. No distention.   Genitourinary: Small 1-2 mm abrasion just inferior to the vaginal introitus Musculoskeletal: No lower extremity tenderness nor edema. No gross deformities of extremities. Neurologic:  Normal speech and language. No gross focal neurologic deficits are appreciated.  Skin:  Skin is warm, dry and intact. No rash noted. Psychiatric: Mood and affect are normal. Speech and behavior are normal. ____________________________  RADIOLOGY I, [place] N Mansur Patti, personally  viewed and evaluated these images (plain radiographs) as part of my medical decision making, as well as reviewing the written report by the radiologist.  ED MD interpretation:  no acute intracranial abnormality or calvarial fracture per radiologist on CT scan of the head.  Official radiology report(s): Ct Head Wo Contrast  Result Date: 06/02/2018 CLINICAL DATA:  84 y/o F; fall with head injury. Bruising to the mid forehead. EXAM: CT HEAD WITHOUT CONTRAST TECHNIQUE: Contiguous axial images were obtained from the base of the skull through the vertex without intravenous contrast. COMPARISON:  08/13/2004 MRI head. FINDINGS: Brain: No evidence of acute infarction, hemorrhage, hydrocephalus, extra-axial collection or mass lesion/mass effect. Mild volume loss of the brain. Vascular: Calcific atherosclerosis of carotid siphons. No hyperdense vessel identified. Skull: Normal. Negative for fracture or focal lesion. Sinuses/Orbits: No acute finding. Other: None.  IMPRESSION: 1. No acute intracranial abnormality or calvarial fracture. 2. Mild volume loss of the brain. Electronically Signed   By: Kristine Garbe M.D.   On: 06/02/2018 03:23      Procedures   ____________________________________________   INITIAL IMPRESSION / ASSESSMENT AND PLAN / ED COURSE  As part of my medical decision making, I reviewed the following data within the electronic MEDICAL RECORD NUMBER   84 year old female presenting with most of the history of physical exam following Accidental fall. CT scan of the head performed to evaluate for any intracranial abnormality however none was noted. Patient be discharged home  Impression ____________________________________________  FINAL CLINICAL IMPRESSION(S) / ED DIAGNOSES  Final diagnoses:  Injury of head, initial encounter     MEDICATIONS GIVEN DURING THIS VISIT:  Medications - No data to display   ED Discharge Orders    None       Note:  This document was  prepared using Dragon voice recognition software and may include unintentional dictation errors.    Gregor Hams, MD 06/02/18 2671    Gregor Hams, MD 06/02/18 [phone]

## 2018-06-02 NOTE — ED Notes (Signed)
Pt stated that she needed to speak to the provider again before I call her husband to come and pick her up.

## 2018-06-02 NOTE — ED Triage Notes (Signed)
Pt was getting up to use the restroom with her walker and it got caught and she fell. Pt hit her head on the floor and she has a bruising and redden area noted to mid forehead, left knee pain and pt stated that she thinks she hit her vagina on the walker because she had some bleeding. Y&HC6

## 2018-06-02 NOTE — ED Notes (Signed)
[phone] called and spoke with Alton(Husband) and he stated that he would be on his way to pick up patient.

## 2019-12-10 ENCOUNTER — Encounter: Payer: Self-pay | Admitting: Emergency Medicine

## 2019-12-10 ENCOUNTER — Other Ambulatory Visit: Payer: Self-pay

## 2019-12-10 ENCOUNTER — Emergency Department
Admission: EM | Admit: 2019-12-10 | Discharge: 2019-12-10 | Disposition: A | Discharge status: Home / Self Care | Payer: Medicare Other | Attending: Emergency Medicine | Admitting: Emergency Medicine

## 2019-12-10 DIAGNOSIS — N390 Urinary tract infection, site not specified: Secondary | ICD-10-CM | POA: Diagnosis not present

## 2019-12-10 DIAGNOSIS — Z87891 Personal history of nicotine dependence: Secondary | ICD-10-CM | POA: Insufficient documentation

## 2019-12-10 DIAGNOSIS — Z96641 Presence of right artificial hip joint: Secondary | ICD-10-CM | POA: Diagnosis not present

## 2019-12-10 DIAGNOSIS — R319 Hematuria, unspecified: Secondary | ICD-10-CM | POA: Diagnosis present

## 2019-12-10 DIAGNOSIS — Z79899 Other long term (current) drug therapy: Secondary | ICD-10-CM | POA: Insufficient documentation

## 2019-12-10 LAB — URINALYSIS, COMPLETE (UACMP) WITH MICROSCOPIC
Bilirubin Urine: NEGATIVE
Glucose, UA: NEGATIVE mg/dL
Ketones, ur: NEGATIVE mg/dL
Nitrite: NEGATIVE
Protein, ur: 100 mg/dL — AB
RBC / HPF: >50 RBC/hpf — ABNORMAL HIGH (ref 0–5)
Specific Gravity, Urine: 1.005 (ref 1.005–1.030)
WBC, UA: >50 WBC/hpf — ABNORMAL HIGH (ref 0–5)
pH: 6 (ref 5.0–8.0)

## 2019-12-10 LAB — BASIC METABOLIC PANEL
Anion gap: 6 (ref 5–15)
BUN: 13 mg/dL (ref 8–23)
CO2: 28 mmol/L (ref 22–32)
Calcium: 8.9 mg/dL (ref 8.9–10.3)
Chloride: 104 mmol/L (ref 98–111)
Creatinine, Ser: 0.82 mg/dL (ref 0.44–1.00)
GFR calc Af Amer: >60 mL/min (ref >60)
GFR calc non Af Amer: >60 mL/min (ref >60)
Glucose, Bld: 88 mg/dL (ref 70–99)
Potassium: 4.2 mmol/L (ref 3.5–5.1)
Sodium: 138 mmol/L (ref 135–145)

## 2019-12-10 LAB — CBC
HCT: 40.6 % (ref 36.0–46.0)
Hemoglobin: 13.9 g/dL (ref 12.0–15.0)
MCH: 32.5 pg (ref 26.0–34.0)
MCHC: 34.2 g/dL (ref 30.0–36.0)
MCV: 94.9 fL (ref 80.0–100.0)
Platelets: 154 10*3/uL (ref 150–400)
RBC: 4.28 MIL/uL (ref 3.87–5.11)
RDW: 12.3 % (ref 11.5–15.5)
WBC: 6.1 10*3/uL (ref 4.0–10.5)
nRBC: 0 % (ref 0.0–0.2)

## 2019-12-10 MED ORDER — CEPHALEXIN 500 MG PO CAPS: 500.0000 mg | ORAL_CAPSULE | Freq: Three times a day (TID) | ORAL | 0 refills | Status: DC

## 2019-12-10 NOTE — ED Notes (Signed)
Call to lab to ensure urine culture is added on to urine.

## 2019-12-10 NOTE — ED Triage Notes (Signed)
Pt to ER states went to bathroom noted hematuria.  Pt states has had low back ache intermittently for last week.  PT denies urinary symptoms or vaginal discharge.

## 2019-12-10 NOTE — ED Provider Notes (Signed)
[facility] Emergency Department Provider Note  ____________________________________________   First MD Initiated Contact with Patient 12/10/19 1319     (approximate)  I have reviewed the triage vital signs and the nursing notes.   HISTORY  Chief Complaint Hematuria    HPI Monica Gonzales is a 86 y.o. female presents to the emergency room planing of hematuria.  Patient states that she had some low back pain, noticed hematuria, no fever or chills.  No vaginal discharge.  No upper back pain.  Had a physical 1 week ago in which her urine was clear.   No pain associated with hematuria.   Past Medical History:  Diagnosis Date  . Abnormal Pap smear of vagina   . Anemia    pernicious  . Arthritis   . Diverticulosis   . Glaucoma (increased eye pressure)   . Headache   . Multiple hemangiomas    OF THE LIVER  . Pernicious anemia   . Rosacea   . Tumor of jaw   . UTI (urinary tract infection)     Patient Active Problem List   Diagnosis Date Noted  . Status post total hip replacement, right 05/17/2018    Past Surgical History:  Procedure Laterality Date  . ABDOMINAL HYSTERECTOMY    . BUNIONECTOMY Left    screw in place  . CATARACT EXTRACTION, BILATERAL  2005  . COLONOSCOPY WITH PROPOFOL N/A 09/02/2015   Procedure: COLONOSCOPY WITH PROPOFOL;  Surgeon: Lollie Sails, MD;  Location: [facility] ENDOSCOPY;  Service: Endoscopy;  Laterality: N/A;  . EYE SURGERY    . jaw tumor resection Left 2012  . OOPHORECTOMY    . TONSILLECTOMY    . TOTAL HIP ARTHROPLASTY Right 05/17/2018   Procedure: TOTAL HIP ARTHROPLASTY ANTERIOR APPROACH;  Surgeon: Hessie Knows, MD;  Location: [facility] ORS;  Service: Orthopedics;  Laterality: Right;    Prior to Admission medications   Medication Sig Start Date End Date Taking? Authorizing Provider  acetaminophen (TYLENOL) 500 MG tablet Take 1-2 tablets (500-1,000 mg total) by mouth every 6 (six) hours as needed for mild pain (pain  score 1-3 or temp > 100.5). 05/19/18   Duanne Guess, PA-C  bisacodyl (DULCOLAX) 5 MG EC tablet Take 1 tablet (5 mg total) by mouth daily as needed for moderate constipation. 05/19/18   Duanne Guess, PA-C  cephALEXin (KEFLEX) 500 MG capsule Take 1 capsule (500 mg total) by mouth 3 (three) times daily. 12/10/19   Haaris Metallo, Linden Dolin, PA-C  cyanocobalamin (,VITAMIN B-12,) 1000 MCG/ML injection Inject 1,000 mcg into the muscle every 30 (thirty) days.    [provider]  fluocinonide cream (LIDEX) [id number] % Apply 1 application topically 2 (two) times daily as needed (for break out).     [provider]  latanoprost (XALATAN) 0.005 % ophthalmic solution Place 1 drop into both eyes at bedtime.    [provider]  methylcellulose (CITRUCEL) oral powder Take 1 packet by mouth daily.    [provider]  metoprolol tartrate (LOPRESSOR) 25 MG tablet Take 1 tablet (25 mg total) by mouth 2 (two) times daily. 05/20/18   Duanne Guess, PA-C  metroNIDAZOLE (METROCREAM) 0.75 % cream Apply 1 application topically 2 (two) times daily as needed (for rosacea).     [provider]  traMADol (ULTRAM) 50 MG tablet Take 1 tablet (50 mg total) by mouth every 6 (six) hours. 05/19/18   Duanne Guess, PA-C  White Petrolatum-Mineral Oil (LUBRICANT EYE) OINT Apply  1 application to eye at bedtime. Left eye    [provider]    Allergies Prednisone, Pyridium [phenazopyridine hcl], and Sulfa antibiotics  Family History  Problem Relation Age of Onset  . Ovarian cancer Mother   . Diabetes Father   . Alcohol abuse Father     Social History Social History   Tobacco Use  . Smoking status: Former Smoker    Types: Cigarettes    Quit date: 1960    Years since quitting: 61.4  . Smokeless tobacco: Never Used  Substance Use Topics  . Alcohol use: No  . Drug use: No    Review of Systems  Constitutional: No fever/chills Eyes: No visual changes. ENT: No sore  throat. Respiratory: Denies cough Cardiovascular: Denies chest pain Gastrointestinal: Denies abdominal pain Genitourinary: Negative for dysuria.  Positive for hematuria Musculoskeletal: Negative for back pain. Skin: Negative for rash. Psychiatric: no mood changes,     ____________________________________________   PHYSICAL EXAM:  VITAL SIGNS: ED Triage Vitals  Enc Vitals Group     BP 12/10/19 1158 (!) 177/67     Pulse Rate 12/10/19 1158 73     Resp 12/10/19 1158 18     Temp 12/10/19 1158 97.8 F (36.6 C)     Temp Source 12/10/19 1158 Oral     SpO2 12/10/19 1158 100 %     Weight 12/10/19 1206 142 lb (64.4 kg)     Height 12/10/19 1206 5\' 1"  (1.549 m)     Head Circumference --      Peak Flow --      Pain Score 12/10/19 1206 0     Pain Loc --      Pain Edu? --      Excl. in [place]? --     Constitutional: Alert and oriented. Well appearing and in no acute distress. Eyes: Conjunctivae are normal.  Head: Atraumatic. Nose: No congestion/rhinnorhea. Mouth/Throat: Mucous membranes are moist.   Neck:  supple no lymphadenopathy noted Cardiovascular: Normal rate, regular rhythm. Heart sounds are normal Respiratory: Normal respiratory effort.  No retractions, lungs c t a  Abd: soft nontender bs normal all 4 quad GU: deferred Musculoskeletal: FROM all extremities, warm and well perfused Neurologic:  Normal speech and language.  Skin:  Skin is warm, dry and intact. No rash noted. Psychiatric: Mood and affect are normal. Speech and behavior are normal.  ____________________________________________   LABS (all labs ordered are listed, but only abnormal results are displayed)  Labs Reviewed  URINALYSIS, COMPLETE (UACMP) WITH MICROSCOPIC - Abnormal; Notable for the following components:      Result Value   Color, Urine AMBER (*)    APPearance HAZY (*)    Hgb urine dipstick LARGE (*)    Protein, ur 100 (*)    Leukocytes,Ua LARGE (*)    RBC / HPF >50 (*)    WBC, UA >50 (*)     Bacteria, UA RARE (*)    All other components within normal limits  URINE CULTURE  BASIC METABOLIC PANEL  CBC   ____________________________________________   ____________________________________________  RADIOLOGY    ____________________________________________   PROCEDURES  Procedure(s) performed: No  Procedures    ____________________________________________   INITIAL IMPRESSION / ASSESSMENT AND PLAN / ED COURSE  Pertinent labs & imaging results that were available during my care of the patient were reviewed by me and considered in my medical decision making (see chart for details).   Patient is 86 year old female presents emergency department with concerns of hematuria.  See HPI  Physical exam shows that patient's vitals are normal.  She appears stable.  Remainder the exam is unremarkable  DDx: Hematuria, kidney stone, back pain  CBC basic metabolic panel normal.  Urinalysis shows hazy urine with large amount of hemoglobin, large amount of leuks, greater than 50 RBCs greater than 50 WBCs with rare bacteria.  White blood cell clumps are present.  The explained findings to the patient.  Placed her on Keflex 500 3 times daily for 7 days.  Culture was ordered to ensure had her on the correct antibiotic.  She is to follow with regular doctor if not improving to 3 days.  Return emergency department worsening.  Did discuss symptoms of pyelonephritis.  Patient does not have any of these at this time but is aware to return if she does.  She was discharged in stable condition.    Monica Gonzales was evaluated in Emergency Department on 12/10/2019 for the symptoms described in the history of present illness. She was evaluated in the context of the global COVID-19 pandemic, which necessitated consideration that the patient might be at risk for infection with the SARS-CoV-2 virus that causes COVID-19. Institutional protocols and algorithms that pertain to the evaluation of patients  at risk for COVID-19 are in a state of rapid change based on information released by regulatory bodies including the CDC and federal and state organizations. These policies and algorithms were followed during the patient's care in the ED.   As part of my medical decision making, I reviewed the following data within the [place] notes reviewed and incorporated, Labs reviewed , Old chart reviewed, Notes from prior ED visits and [place] Controlled Substance Database  ____________________________________________   FINAL CLINICAL IMPRESSION(S) / ED DIAGNOSES  Final diagnoses:  Urinary tract infection with hematuria, site unspecified      NEW MEDICATIONS STARTED DURING THIS VISIT:  New Prescriptions   CEPHALEXIN (KEFLEX) 500 MG CAPSULE    Take 1 capsule (500 mg total) by mouth 3 (three) times daily.     Note:  This document was prepared using Dragon voice recognition software and may include unintentional dictation errors.    Versie Starks, PA-C 12/10/19 1331    Blake Divine, MD 12/10/19 1440

## 2019-12-10 NOTE — Discharge Instructions (Addendum)
Follow-up with your regular doctor if not improving in 2 to 3 days.  Return emergency department worsening.  A culture has been ordered on your urine.  These results should be available in 2 to 3 days.  If it is resistant to the antibiotic I have placed you on they will call you and and give you a different antibiotic.

## 2019-12-12 LAB — URINE CULTURE: Culture: 70000 — AB

## 2019-12-13 NOTE — Consult Note (Addendum)
Attempted to call patient about urine culture results. No answer, left voice message with a call back number. Discussed case with Dr. Jacqualine Code. Plan is to switch abx to amoxicillin 500 mg TID x 5 days. Urine culture is growing enterococcus and is not effective with keflex.   Patient returned phone call. Informed patient of urine culture results. [facility] Informed patient to follow up with PCP or return to the ED if she does not feel better after taking the abx.   Thanks,  Eleonore Chiquito, PharmD, BCPS

## 2020-01-29 IMAGING — DX DG HIP (WITH OR WITHOUT PELVIS) 2-3V*R*
2 series · 2 of 2 positions shown · non-contrast
Comparison: None.

CLINICAL DATA: Right hip replacement

EXAM:
DG HIP (WITH OR WITHOUT PELVIS) 2-3V RIGHT

[pelvis ap]
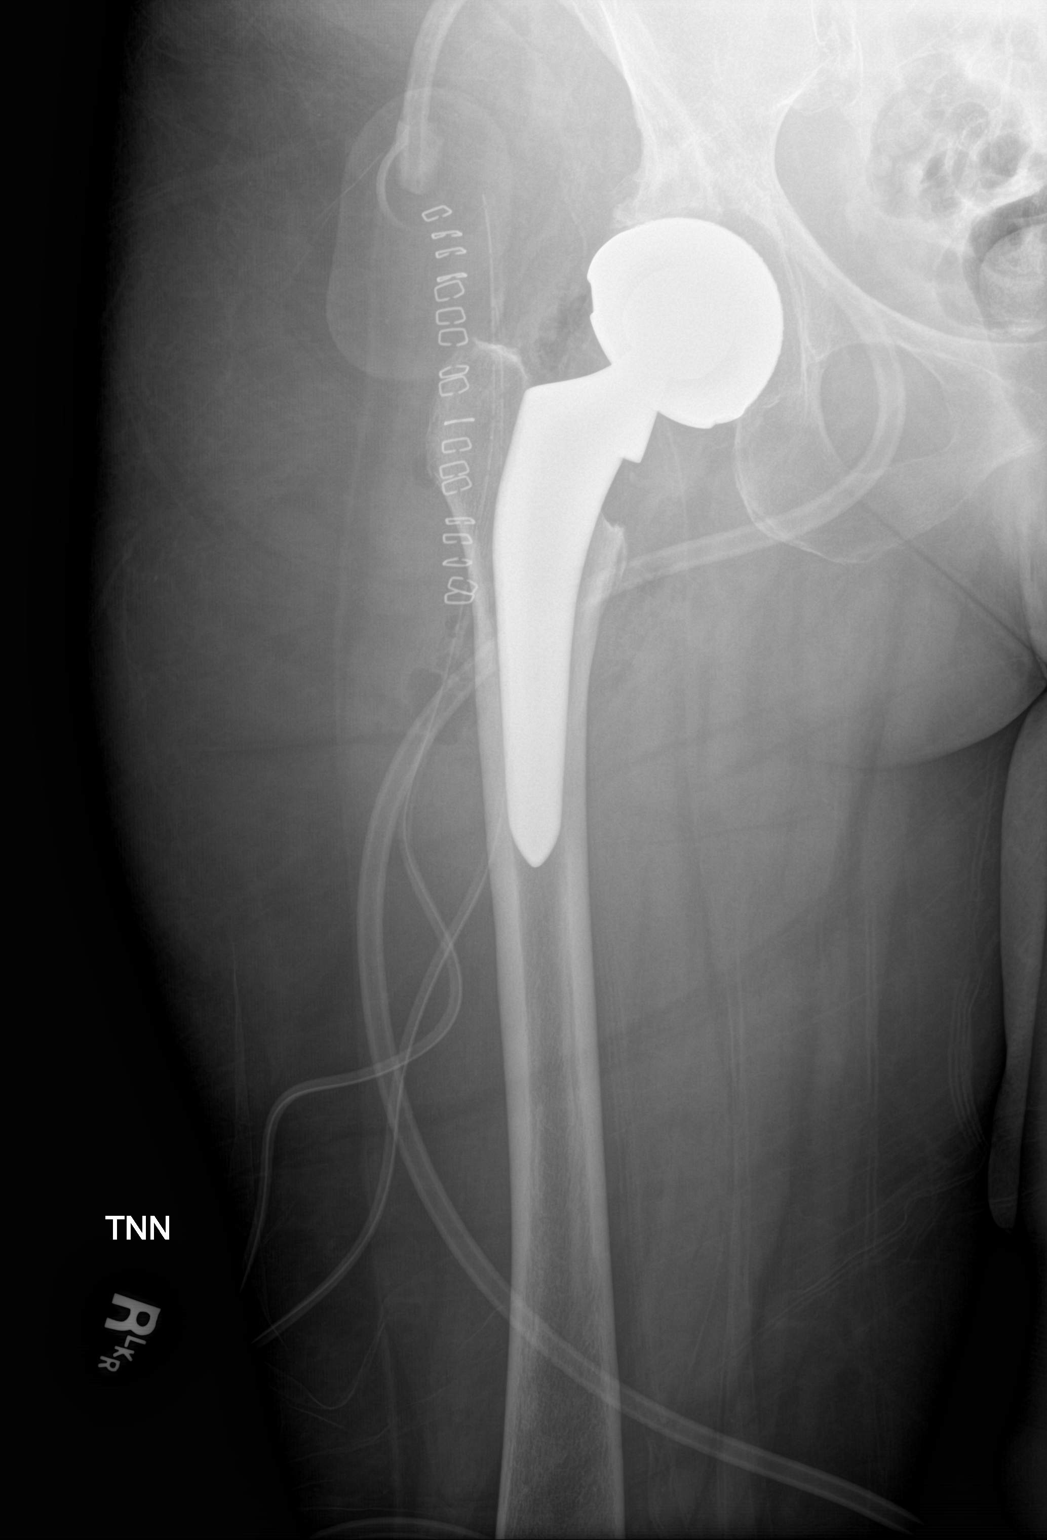

[hip lat]
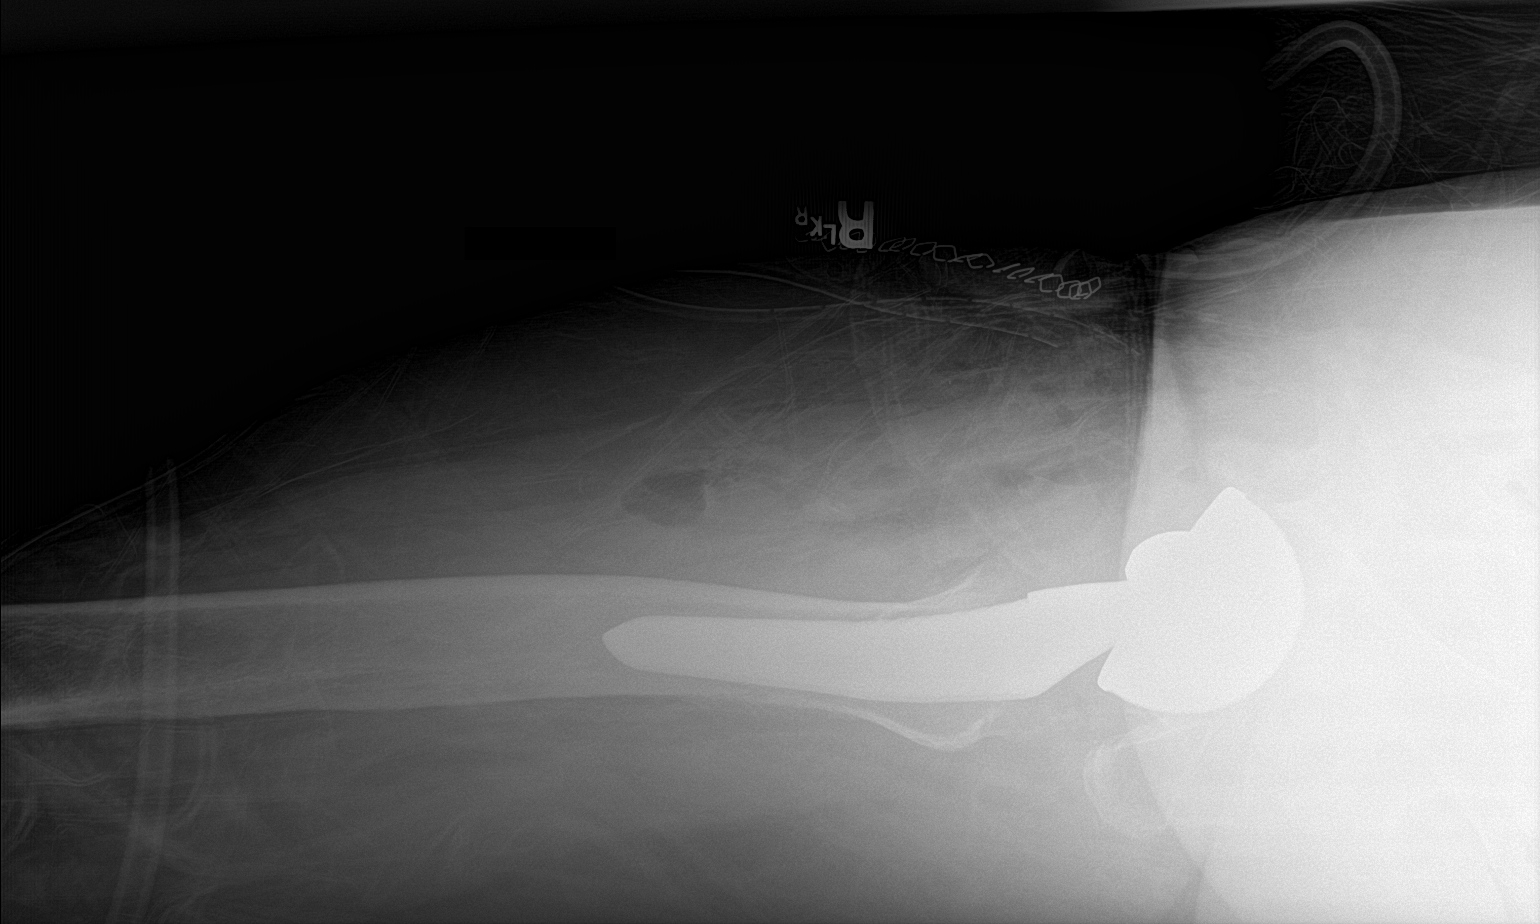

[2 of 2 positions shown; findings below may reference images not displayed]

FINDINGS: Status post right hip replacement with normal alignment. No fracture
is seen. Surgical drain in the right hip soft tissues with cutaneous
staples and small amount of soft tissue gas.
IMPRESSION: Status post right hip replacement with expected surgical changes

## 2021-03-19 ENCOUNTER — Other Ambulatory Visit: Payer: Self-pay

## 2021-03-19 ENCOUNTER — Ambulatory Visit (INDEPENDENT_AMBULATORY_CARE_PROVIDER_SITE_OTHER): Payer: Medicare Other | Admitting: Dermatology

## 2021-03-19 DIAGNOSIS — L448 Other specified papulosquamous disorders: Secondary | ICD-10-CM

## 2021-03-19 MED ORDER — FLUOCINONIDE 0.05 % EX SOLN: 1.0000 "application " | CUTANEOUS | 3 refills | Status: DC

## 2021-03-19 MED ORDER — FLUOCINOLONE ACETONIDE SCALP 0.01 % EX OIL: 1.0000 "application " | TOPICAL_OIL | CUTANEOUS | 3 refills | Status: DC

## 2021-03-19 MED ORDER — KETOCONAZOLE 2 % EX SHAM: 1.0000 "application " | MEDICATED_SHAMPOO | CUTANEOUS | 3 refills | Status: DC

## 2021-03-19 NOTE — Progress Notes (Signed)
   New Patient Visit  Subjective  Monica Gonzales is a 87 y.o. female who presents for the following: New Patient (Initial Visit) (Patient here today to have scalp checked. States hair stylist suggested patient have scalp checked. Patient reports some itchy scaly spots on scalp she noticed 3 weeks or longer. ).  She gets her hair done once weekly.   The following portions of the chart were reviewed this encounter and updated as appropriate:       Review of Systems:  No other skin or systemic complaints except as noted in HPI or Assessment and Plan.  Objective  Well appearing patient in no apparent distress; mood and affect are within normal limits.  A focused examination was performed including scalp. Relevant physical exam findings are noted in the Assessment and Plan.  Scalp Thick Sscaly patches with matted hairs on right occipital, right parietal scalp and left crown.    Assessment & Plan  Pityriasis amiantacea Scalp  Chronic condition that can cause thick scale to mat down hair, related to seborrheic dermatitis   Start Ketoconazole 2 % shampoo - apply 1 times per week, massage into scalp and leave in for 10 minutes before rinsing out  Start Fluocinolone Acetonide Scalp (derma-smoothe/fs scalp ) 0.01 % oil - Apply 1 application topically once a week. Apply to scalp , cover with shower cap leave over night, and wash out once weekly.   Start Fluocinonide (Lidex) 0.05 % external solution - Apply 1 application topically to scaly areas of scalp 1 - 2  times daily   Discussed that condition may take longer to improve since only able to use Rx once weekly due to hair styling routine  Advised patient to contact us if Dermasmoothe is too expensive will resend to [facility] in [place].   Fluocinolone Acetonide Scalp (DERMA-SMOOTHE/FS SCALP) 0.01 % OIL - Scalp Apply 1 application topically once a week. Apply to scalp , cover with shower cap leave over night,  and wash out once  weekly  ketoconazole (NIZORAL) 2 % shampoo - Scalp Apply 1 application topically once a week. apply 1 times per week, massage into scalp and leave in for 10 minutes before rinsing out can follow with conditioner after  fluocinonide (LIDEX) 0.05 % external solution - Scalp Apply 1 application topically See admin instructions. Apply to scaly areas of scalp 1 - 2 times daily  Return for 2 month follow up on scalp .  I, Ruthell Rummage, CMA, am acting as scribe for Brendolyn Patty, MD.  Documentation: I have reviewed the above documentation for accuracy and completeness, and I agree with the above.  Brendolyn Patty MD

## 2021-03-19 NOTE — Patient Instructions (Addendum)

## 2021-03-24 NOTE — Addendum Note (Signed)
Addended by: Brendolyn Patty on: 03/24/2021 09:16 AM   Modules accepted: Level of Service

## 2021-05-26 ENCOUNTER — Ambulatory Visit: Payer: Medicare Other | Admitting: Dermatology

## 2021-05-27 ENCOUNTER — Ambulatory Visit (INDEPENDENT_AMBULATORY_CARE_PROVIDER_SITE_OTHER): Payer: Medicare Other | Admitting: Dermatology

## 2021-05-27 ENCOUNTER — Other Ambulatory Visit: Payer: Self-pay

## 2021-05-27 DIAGNOSIS — L448 Other specified papulosquamous disorders: Secondary | ICD-10-CM | POA: Diagnosis not present

## 2021-05-27 NOTE — Patient Instructions (Addendum)
On the morning of hair appointment, apply fluocinolone oil massaging into scalp and leave on. At hair appointment, massage Ketoconazole 2% shampoo into scalp and let sit for 10 minutes before rinsing.   If you have any questions or concerns for your doctor, please call our main line at [phone] and press option 4 to reach your doctor's medical assistant. If no one answers, please leave a voicemail as directed and we will return your call as soon as possible. Messages left after 4 pm will be answered the following business day.   You may also send [place] a message via [place]. We typically respond to MyChart messages within 1-2 business days.  For prescription refills, please ask your pharmacy to contact our office. Our fax number is [phone].  If you have an urgent issue when the clinic is closed that cannot wait until the next business day, you can page your doctor at the number below.    Please note that while we do our best to be available for urgent issues outside of office hours, we are not available 24/7.   If you have an urgent issue and are unable to reach [place], you may choose to seek medical care at your doctor's office, retail clinic, urgent care center, or emergency room.  If you have a medical emergency, please immediately call 911 or go to the emergency department.  Pager Numbers  - Dr. Nehemiah Massed: [phone]  - Dr. Laurence Ferrari: [phone]  - Dr. Nicole Kindred: [phone]  In the event of inclement weather, please call our main line at [phone] for an update on the status of any delays or closures.  Dermatology Medication Tips: Please keep the boxes that topical medications come in in order to help keep track of the instructions about where and how to use these. Pharmacies typically print the medication instructions only on the boxes and not directly on the medication tubes.   If your medication is too expensive, please contact our office at [phone] option 4 or send [place] a  message through [place].   We are unable to tell what your co-pay for medications will be in advance as this is different depending on your insurance coverage. However, we may be able to find a substitute medication at lower cost or fill out paperwork to get insurance to cover a needed medication.   If a prior authorization is required to get your medication covered by your insurance company, please allow [place] 1-2 business days to complete this process.  Drug prices often vary depending on where the prescription is filled and some pharmacies may offer cheaper prices.  The website www.goodrx.com contains coupons for medications through different pharmacies. The prices here do not account for what the cost may be with help from insurance (it may be cheaper with your insurance), but the website can give you the price if you did not use any insurance.  - You can print the associated coupon and take it with your prescription to the pharmacy.  - You may also stop by our office during regular business hours and pick up a GoodRx coupon card.  - If you need your prescription sent electronically to a different pharmacy, notify our office through [facility] or by phone at [phone] option 4.

## 2021-05-27 NOTE — Progress Notes (Signed)
   Follow-Up Visit   Subjective  Monica Gonzales is a 87 y.o. female who presents for the following: Pityriasis amiantacea (Scalp, 2 month follow-up. Patient is much improved using Ketoconazole 2% shampoo once a week. She tried using Derma-Smoothe oil, but can't use at night with shower cap due to the noise it makes (unable to sleep). She has fluocinonide solution, but she hasn't used. Scalp is no longer itchy. ). Patient has a history of Lichen Planus.    The following portions of the chart were reviewed this encounter and updated as appropriate:       Review of Systems:  No other skin or systemic complaints except as noted in HPI or Assessment and Plan.  Objective  Well appearing patient in no apparent distress; mood and affect are within normal limits.  A focused examination was performed including face, scalp. Relevant physical exam findings are noted in the Assessment and Plan.  Scalp Scaly patches with adherent matted hairs of the right parietal and occipital scalp.   Assessment & Plan  Pityriasis amiantacea Scalp  Some mild improvement, still significant involvement Chronic inflammatory condition, not at goal  Pt won't use o/n shower cap, because it keeps her from sleeping. On the morning of weekly hair appointment, apply generous amount of fluocinolone oil massaging into scalp and leave on. At hair appointment, massage Ketoconazole 2% shampoo into scalp and let sit for 10 minutes before rinsing.   Fluocinonide solution apply to AA scalp qd/bid  Related Medications Fluocinolone Acetonide Scalp (DERMA-SMOOTHE/FS SCALP) 0.01 % OIL Apply 1 application topically once a week. Apply to scalp , cover with shower cap leave over night,  and wash out once weekly  ketoconazole (NIZORAL) 2 % shampoo Apply 1 application topically once a week. apply 1 times per week, massage into scalp and leave in for 10 minutes before rinsing out can follow with conditioner after  fluocinonide  (LIDEX) 0.05 % external solution Apply 1 application topically See admin instructions. Apply to scaly areas of scalp 1 - 2 times daily  Return in about 3 months (around 08/27/2021) for recheck scalp.  IJamesetta Orleans, CMA, am acting as scribe for Brendolyn Patty, MD .  Documentation: I have reviewed the above documentation for accuracy and completeness, and I agree with the above.  Brendolyn Patty MD

## 2021-09-01 ENCOUNTER — Other Ambulatory Visit: Payer: Self-pay

## 2021-09-01 ENCOUNTER — Encounter: Payer: Self-pay | Admitting: Dermatology

## 2021-09-01 ENCOUNTER — Ambulatory Visit (INDEPENDENT_AMBULATORY_CARE_PROVIDER_SITE_OTHER): Payer: Medicare Other | Admitting: Dermatology

## 2021-09-01 DIAGNOSIS — L448 Other specified papulosquamous disorders: Secondary | ICD-10-CM

## 2021-09-01 MED ORDER — KETOCONAZOLE 2 % EX SHAM: 1.0000 "application " | MEDICATED_SHAMPOO | CUTANEOUS | 3 refills | Status: DC

## 2021-09-01 MED ORDER — CALCIPOTRIENE 0.005 % EX SOLN: 2.0000 [drp] | Freq: Two times a day (BID) | CUTANEOUS | 2 refills | Status: DC

## 2021-09-01 NOTE — Patient Instructions (Signed)

## 2021-09-01 NOTE — Progress Notes (Signed)
° °  Follow-Up Visit   Subjective  Monica Gonzales is a 88 y.o. female who presents for the following: Follow-up.  Patient has Pityriasis Amiantacea of the scalp. She states she is improved with using ketoconazole 2% shampoo. She was unable to use the fluocinonide solution, even though only applying to scalp, she would develop redness and a prickly feeling on the face. She is not using fluocinolone oil at this time.    The following portions of the chart were reviewed this encounter and updated as appropriate:       Review of Systems:  No other skin or systemic complaints except as noted in HPI or Assessment and Plan.  Objective  Well appearing patient in no apparent distress; mood and affect are within normal limits.  A focused examination was performed including face, scalp. Relevant physical exam findings are noted in the Assessment and Plan.  Scalp Scaly patch of the right occipital scalp; mild scaling of the scalp    Assessment & Plan  Pityriasis amiantacea Scalp  Chronic inflammatory condition, improving but not clear  Pt won't use shower cap, because it keeps her from sleeping. The night before going to salon, patient to saturate aa scalp with mineral oil, sleep over night (use old pillow case) and wash with ketoconazole shampoo the next day at salon.  Continue Ketoconazole 2% shampoo - massage into scalp and let sit for 10 minutes, then rinse. Patient uses once a week before having hair done at salon.  Pt not able to tolerate topical steroid because of making her face turn red after using it. Start Calcipotriene solution bid to aa scalp         Calcipotriene 0.005 % solution - Scalp Apply 2 drops topically 2 (two) times daily. Apply to affected areas scalp as needed.  Related Medications ketoconazole (NIZORAL) 2 % shampoo Apply 1 application topically once a week. apply 1 times per week, massage into scalp and leave in for 10 minutes before rinsing out can  follow with conditioner after   Return in about 6 months (around 03/01/2022).  IJamesetta Orleans, CMA, am acting as scribe for Brendolyn Patty, MD . Documentation: I have reviewed the above documentation for accuracy and completeness, and I agree with the above.  Brendolyn Patty MD

## 2022-03-09 ENCOUNTER — Ambulatory Visit: Payer: Medicare Other | Admitting: Dermatology

## 2022-03-11 ENCOUNTER — Ambulatory Visit (INDEPENDENT_AMBULATORY_CARE_PROVIDER_SITE_OTHER): Payer: Medicare Other | Admitting: Dermatology

## 2022-03-11 DIAGNOSIS — L720 Epidermal cyst: Secondary | ICD-10-CM

## 2022-03-11 DIAGNOSIS — D1800 Hemangioma unspecified site: Secondary | ICD-10-CM | POA: Diagnosis not present

## 2022-03-11 DIAGNOSIS — L448 Other specified papulosquamous disorders: Secondary | ICD-10-CM

## 2022-03-11 MED ORDER — KETOCONAZOLE 2 % EX SHAM: 1.0000 | MEDICATED_SHAMPOO | CUTANEOUS | 3 refills | Status: DC

## 2022-03-11 NOTE — Patient Instructions (Addendum)
Continue Ketoconazole 2% shampoo to stubborn area Resume Calcipotriene solution as needed for flares.  Due to recent changes in healthcare laws, you may see results of your pathology and/or laboratory studies on MyChart before the doctors have had a chance to review them. We understand that in some cases there may be results that are confusing or concerning to you. Please understand that not all results are received at the same time and often the doctors may need to interpret multiple results in order to provide you with the best plan of care or course of treatment. Therefore, we ask that you please give [place] 2 business days to thoroughly review all your results before contacting the office for clarification. Should we see a critical lab result, you will be contacted sooner.   If You Need Anything After Your Visit  If you have any questions or concerns for your doctor, please call our main line at [phone] and press option 4 to reach your doctor's medical assistant. If no one answers, please leave a voicemail as directed and we will return your call as soon as possible. Messages left after 4 pm will be answered the following business day.   You may also send [place] a message via [place]. We typically respond to MyChart messages within 1-2 business days.  For prescription refills, please ask your pharmacy to contact our office. Our fax number is [phone].  If you have an urgent issue when the clinic is closed that cannot wait until the next business day, you can page your doctor at the number below.    Please note that while we do our best to be available for urgent issues outside of office hours, we are not available 24/7.   If you have an urgent issue and are unable to reach [place], you may choose to seek medical care at your doctor's office, retail clinic, urgent care center, or emergency room.  If you have a medical emergency, please immediately call 911 or go to the emergency  department.  Pager Numbers  - Dr. Nehemiah Massed: [phone]  - Dr. Laurence Ferrari: [phone]  - Dr. Nicole Kindred: [phone]  In the event of inclement weather, please call our main line at [phone] for an update on the status of any delays or closures.  Dermatology Medication Tips: Please keep the boxes that topical medications come in in order to help keep track of the instructions about where and how to use these. Pharmacies typically print the medication instructions only on the boxes and not directly on the medication tubes.   If your medication is too expensive, please contact our office at [phone] option 4 or send [place] a message through [place].   We are unable to tell what your co-pay for medications will be in advance as this is different depending on your insurance coverage. However, we may be able to find a substitute medication at lower cost or fill out paperwork to get insurance to cover a needed medication.   If a prior authorization is required to get your medication covered by your insurance company, please allow [place] 1-2 business days to complete this process.  Drug prices often vary depending on where the prescription is filled and some pharmacies may offer cheaper prices.  The website www.goodrx.com contains coupons for medications through different pharmacies. The prices here do not account for what the cost may be with help from insurance (it may be cheaper with your insurance), but the website can give you the price if you did not  use any insurance.  - You can print the associated coupon and take it with your prescription to the pharmacy.  - You may also stop by our office during regular business hours and pick up a GoodRx coupon card.  - If you need your prescription sent electronically to a different pharmacy, notify our office through [facility] or by phone at [phone] option 4.     Si Usted Necesita Algo Despus de Su Visita  Tambin puede enviarnos un  mensaje a travs de Pharmacist, community. Por lo general respondemos a los mensajes de MyChart en el transcurso de 1 a 2 das hbiles.  Para renovar recetas, por favor pida a su farmacia que se ponga en contacto con nuestra oficina. Harland Dingwall de fax es [place] [phone].  Si tiene un asunto urgente cuando la clnica est cerrada y que no puede esperar hasta el siguiente da hbil, puede llamar/localizar a su doctor(a) al nmero que aparece a continuacin.   Por favor, tenga en cuenta que aunque hacemos todo lo posible para estar disponibles para asuntos urgentes fuera del horario de [place], no estamos disponibles las 24 horas del da, los 7 das de la [place].   Si tiene un problema urgente y no puede comunicarse con nosotros, puede optar por buscar atencin mdica  en el consultorio de su doctor(a), en una clnica privada, en un centro de atencin urgente o en una sala de emergencias.  Si tiene Engineering geologist, por favor llame inmediatamente al 911 o vaya a la sala de emergencias.  Nmeros de bper  - Dr. Nehemiah Massed: [phone]  - Dra. Moye: [phone]  - Dra. Nicole Kindred: [phone]  En caso de inclemencias del [place], por favor llame a Johnsie Kindred principal al [phone] para una actualizacin sobre el [place] de cualquier retraso o cierre.  Consejos para la medicacin en dermatologa: Por favor, guarde las cajas en las que vienen los medicamentos de uso tpico para ayudarle a seguir las instrucciones sobre dnde y cmo usarlos. Las farmacias generalmente imprimen las instrucciones del medicamento slo en las cajas y no directamente en los tubos del [place].   Si su medicamento es muy caro, por favor, pngase en contacto con Zigmund Daniel llamando al [phone] y presione la opcin 4 o envenos un mensaje a travs de Pharmacist, community.   No podemos decirle cul ser su copago por los medicamentos por adelantado ya que esto es diferente dependiendo de la cobertura de su seguro. Sin embargo,  es posible que podamos encontrar un medicamento sustituto a Electrical engineer un formulario para que el seguro cubra el medicamento que se considera necesario.   Si se requiere una autorizacin previa para que su compaa de seguros [place] su medicamento, por favor permtanos de 1 a 2 das hbiles para completar este proceso.  Los precios de los medicamentos varan con frecuencia dependiendo del Environmental consultant de dnde se surte la receta y alguna farmacias pueden ofrecer precios ms baratos.  El sitio web www.goodrx.com tiene cupones para medicamentos de Airline pilot. Los precios aqu no tienen en cuenta lo que podra costar con la ayuda del seguro (puede ser ms barato con su seguro), pero el sitio web puede darle el precio si no utiliz Research scientist (physical sciences).  - Puede imprimir el cupn correspondiente y llevarlo con su receta a la farmacia.  - Tambin puede pasar por nuestra oficina durante el horario de atencin regular y Charity fundraiser una tarjeta de cupones de GoodRx.  - Si necesita que su receta se enve electrnicamente  a [employer], informe a nuestra oficina a travs de MyChart de [place] o por telfono llamando al [phone] y presione la opcin 4.

## 2022-03-11 NOTE — Progress Notes (Signed)
   Follow-Up Visit   Subjective  Monica Gonzales is a 88 y.o. female who presents for the following: Follow-up (6 month recheck. Pityriasis Amiantacea of the scalp. She states she is improved and is no longer using Calcipotriene solution or Ketoconazole shampoo. States her hairdresser says it is 100% improved ).    The following portions of the chart were reviewed this encounter and updated as appropriate:      Review of Systems: No other skin or systemic complaints except as noted in HPI or Assessment and Plan.   Objective  Well appearing patient in no apparent distress; mood and affect are within normal limits.  A focused examination was performed including head, including the scalp, face, neck, nose, ears, eyelids, and lips. Relevant physical exam findings are noted in the Assessment and Plan.  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 Improved with small area of recurrence.  Continue Ketoconazole 2% shampoo to scalp, massage into stubborn area, let sit 5-10 minutes prior to rinsing Resume Calcipotriene solution qd/bid to aa scalp as needed for flares.    Related Medications Calcipotriene 0.005 % solution Apply 2 drops topically 2 (two) times daily. Apply to affected areas scalp as needed.  ketoconazole (NIZORAL) 2 % shampoo Apply 1 Application topically once a week. apply 1 times per week, massage into scalp and leave in for 10 minutes before rinsing out can follow with conditioner after   Hemangiomas. Scalp. - Red papules - Discussed benign nature - Observe - Call for any changes  Milia. Scalp - tiny firm white papules - type of cyst - benign - may be extracted if symptomatic - observe   Return in 1 year (on 03/12/2023) for scalp recheck.  I, Emelia Salisbury, CMA, am acting as scribe for Brendolyn Patty, MD.  Documentation: I have  reviewed the above documentation for accuracy and completeness, and I agree with the above.  Brendolyn Patty MD

## 2022-05-18 ENCOUNTER — Ambulatory Visit (INDEPENDENT_AMBULATORY_CARE_PROVIDER_SITE_OTHER): Payer: Medicare Other | Admitting: Dermatology

## 2022-05-18 DIAGNOSIS — L821 Other seborrheic keratosis: Secondary | ICD-10-CM | POA: Diagnosis not present

## 2022-05-18 DIAGNOSIS — L814 Other melanin hyperpigmentation: Secondary | ICD-10-CM

## 2022-05-18 DIAGNOSIS — L820 Inflamed seborrheic keratosis: Secondary | ICD-10-CM

## 2022-05-18 DIAGNOSIS — D1801 Hemangioma of skin and subcutaneous tissue: Secondary | ICD-10-CM

## 2022-05-18 NOTE — Progress Notes (Signed)
   Follow-Up Visit   Subjective  Monica Gonzales is a 88 y.o. female who presents for the following: check spot (L flank, 62m itchy, hx of bleeding when she bumped).   The following portions of the chart were reviewed this encounter and updated as appropriate:       Review of Systems:  No other skin or systemic complaints except as noted in HPI or Assessment and Plan.  Objective  Well appearing patient in no apparent distress; mood and affect are within normal limits.  A focused examination was performed including left flank. Relevant physical exam findings are noted in the Assessment and Plan.  Left Flank x 1 Stuck on waxy papule with erythema    Assessment & Plan  Inflamed seborrheic keratosis Left Flank x 1  Symptomatic, irritating, patient would like treated.   Destruction of lesion - Left Flank x 1  Destruction method: cryotherapy   Informed consent: discussed and consent obtained   Lesion destroyed using liquid nitrogen: Yes   Region frozen until ice ball extended beyond lesion: Yes   Outcome: patient tolerated procedure well with no complications   Post-procedure details: wound care instructions given   Additional details:  Prior to procedure, discussed risks of blister formation, small wound, skin dyspigmentation, or rare scar following cryotherapy. Recommend Vaseline ointment to treated areas while healing.    Hemangiomas - Red papules - Discussed benign nature - Observe - Call for any changes - trunk  Lentigines - Scattered tan macules - Due to sun exposure - Benign-appearing, observe - Recommend daily broad spectrum sunscreen SPF 30+ to sun-exposed areas, reapply every 2 hours as needed. - Call for any changes - trunk  Seborrheic Keratoses - Stuck-on, waxy, tan-brown papules and/or plaques  - Benign-appearing - Discussed benign etiology and prognosis. - Observe - Call for any changes - trunk   Return for as scheduled.  I, SOthelia Pulling  RMA, am acting as scribe for TBrendolyn Patty MD .  Documentation: I have reviewed the above documentation for accuracy and completeness, and I agree with the above.  TBrendolyn PattyMD

## 2022-05-18 NOTE — Patient Instructions (Addendum)
Cryotherapy Aftercare  Wash gently with soap and water everyday.   Apply Vaseline and Band-Aid daily until healed.     Due to recent changes in healthcare laws, you may see results of your pathology and/or laboratory studies on MyChart before the doctors have had a chance to review them. We understand that in some cases there may be results that are confusing or concerning to you. Please understand that not all results are received at the same time and often the doctors may need to interpret multiple results in order to provide you with the best plan of care or course of treatment. Therefore, we ask that you please give us 2 business days to thoroughly review all your results before contacting the office for clarification. Should we see a critical lab result, you will be contacted sooner.   If You Need Anything After Your Visit  If you have any questions or concerns for your doctor, please call our main line at [phone] and press option 4 to reach your doctor's medical assistant. If no one answers, please leave a voicemail as directed and we will return your call as soon as possible. Messages left after 4 pm will be answered the following business day.   You may also send us a message via MyChart. We typically respond to MyChart messages within 1-2 business days.  For prescription refills, please ask your pharmacy to contact our office. Our fax number is [phone].  If you have an urgent issue when the clinic is closed that cannot wait until the next business day, you can page your doctor at the number below.    Please note that while we do our best to be available for urgent issues outside of office hours, we are not available 24/7.   If you have an urgent issue and are unable to reach us, you may choose to seek medical care at your doctor's office, retail clinic, urgent care center, or emergency room.  If you have a medical emergency, please immediately call 911 or go to the  emergency department.  Pager Numbers  - Dr. Kowalski: [phone]  - Dr. Moye: [phone]  - Dr. Stewart: [phone]  In the event of inclement weather, please call our main line at [phone] for an update on the status of any delays or closures.  Dermatology Medication Tips: Please keep the boxes that topical medications come in in order to help keep track of the instructions about where and how to use these. Pharmacies typically print the medication instructions only on the boxes and not directly on the medication tubes.   If your medication is too expensive, please contact our office at [phone] option 4 or send us a message through MyChart.   We are unable to tell what your co-pay for medications will be in advance as this is different depending on your insurance coverage. However, we may be able to find a substitute medication at lower cost or fill out paperwork to get insurance to cover a needed medication.   If a prior authorization is required to get your medication covered by your insurance company, please allow us 1-2 business days to complete this process.  Drug prices often vary depending on where the prescription is filled and some pharmacies may offer cheaper prices.  The website www.goodrx.com contains coupons for medications through different pharmacies. The prices here do not account for what the cost may be with help from insurance (it may be cheaper with your insurance), but the website can   give you the price if you did not use any insurance.  - You can print the associated coupon and take it with your prescription to the pharmacy.  - You may also stop by our office during regular business hours and pick up a GoodRx coupon card.  - If you need your prescription sent electronically to a different pharmacy, notify our office through [place] MyChart or by phone at [phone] option 4.     Si Usted Necesita Algo Despus de Su Visita  Tambin puede  enviarnos un mensaje a travs de MyChart. Por lo general respondemos a los mensajes de MyChart en el transcurso de 1 a 2 das hbiles.  Para renovar recetas, por favor pida a su farmacia que se ponga en contacto con nuestra oficina. Nuestro nmero de fax es el [phone].  Si tiene un asunto urgente cuando la clnica est cerrada y que no puede esperar hasta el siguiente da hbil, puede llamar/localizar a su doctor(a) al nmero que aparece a continuacin.   Por favor, tenga en cuenta que aunque hacemos todo lo posible para estar disponibles para asuntos urgentes fuera del horario de oficina, no estamos disponibles las 24 horas del da, los 7 das de la semana.   Si tiene un problema urgente y no puede comunicarse con nosotros, puede optar por buscar atencin mdica  en el consultorio de su doctor(a), en una clnica privada, en un centro de atencin urgente o en una sala de emergencias.  Si tiene una emergencia mdica, por favor llame inmediatamente al 911 o vaya a la sala de emergencias.  Nmeros de bper  - Dr. Kowalski: [phone]  - Dra. Moye: [phone]  - Dra. Stewart: [phone]  En caso de inclemencias del tiempo, por favor llame a nuestra lnea principal al [phone] para una actualizacin sobre el estado de cualquier retraso o cierre.  Consejos para la medicacin en dermatologa: Por favor, guarde las cajas en las que vienen los medicamentos de uso tpico para ayudarle a seguir las instrucciones sobre dnde y cmo usarlos. Las farmacias generalmente imprimen las instrucciones del medicamento slo en las cajas y no directamente en los tubos del medicamento.   Si su medicamento es muy caro, por favor, pngase en contacto con nuestra oficina llamando al [phone] y presione la opcin 4 o envenos un mensaje a travs de MyChart.   No podemos decirle cul ser su copago por los medicamentos por adelantado ya que esto es diferente dependiendo de la cobertura de su seguro.  Sin embargo, es posible que podamos encontrar un medicamento sustituto a menor costo o llenar un formulario para que el seguro cubra el medicamento que se considera necesario.   Si se requiere una autorizacin previa para que su compaa de seguros cubra su medicamento, por favor permtanos de 1 a 2 das hbiles para completar este proceso.  Los precios de los medicamentos varan con frecuencia dependiendo del lugar de dnde se surte la receta y alguna farmacias pueden ofrecer precios ms baratos.  El sitio web www.goodrx.com tiene cupones para medicamentos de diferentes farmacias. Los precios aqu no tienen en cuenta lo que podra costar con la ayuda del seguro (puede ser ms barato con su seguro), pero el sitio web puede darle el precio si no utiliz ningn seguro.  - Puede imprimir el cupn correspondiente y llevarlo con su receta a la farmacia.  - Tambin puede pasar por nuestra oficina durante el horario de atencin regular y recoger una tarjeta de cupones de GoodRx.  -   Si necesita que su receta se enve electrnicamente a una farmacia diferente, informe a nuestra oficina a travs de MyChart de [place] o por telfono llamando al [phone] y presione la opcin 4.  

## 2023-02-17 ENCOUNTER — Ambulatory Visit (INDEPENDENT_AMBULATORY_CARE_PROVIDER_SITE_OTHER): Payer: Medicare Other | Admitting: Dermatology

## 2023-02-17 VITALS — BP 137/81 | HR 81

## 2023-02-17 DIAGNOSIS — L448 Other specified papulosquamous disorders: Secondary | ICD-10-CM

## 2023-02-17 DIAGNOSIS — D1724 Benign lipomatous neoplasm of skin and subcutaneous tissue of left leg: Secondary | ICD-10-CM

## 2023-02-17 DIAGNOSIS — D492 Neoplasm of unspecified behavior of bone, soft tissue, and skin: Secondary | ICD-10-CM

## 2023-02-17 MED ORDER — KETOCONAZOLE 2 % EX SHAM: 1.0000 | MEDICATED_SHAMPOO | CUTANEOUS | 6 refills | Status: DC

## 2023-02-17 NOTE — Progress Notes (Signed)
   Follow-Up Visit   Subjective  Monica Gonzales is a 89 y.o. female who presents for the following: Yearly f/u on scalp  Pityriasis amiantacea, treating with Ketoconazole shampoo with a good response.  The patient has spots, moles and lesions to be evaluated, some may be new or changing and the patient may have concern these could be cancer.   The following portions of the chart were reviewed this encounter and updated as appropriate: medications, allergies, medical history  Review of Systems:  No other skin or systemic complaints except as noted in HPI or Assessment and Plan.  Objective  Well appearing patient in no apparent distress; mood and affect are within normal limits.  A focused examination was performed of the following areas:scalp    Relevant exam findings are noted in the Assessment and Plan.  left popliteal 1.0 cm flesh nodule     Assessment & Plan   Pityriasis amiantacea- improved  Scalp- clear today    Chronic condition with duration or expected duration over one year. Currently well-controlled on treatment.    Continue Ketoconazole 2% shampoo to scalp, massage into stubborn area, let sit 5-10 minutes prior to rinsing  Neoplasm of skin left popliteal  Epidermal / dermal shaving  Lesion diameter (cm):  1.2 Informed consent: discussed and consent obtained   Timeout: patient name, date of birth, surgical site, and procedure verified   Procedure prep:  Patient was prepped and draped in usual sterile fashion Prep type:  Isopropyl alcohol Anesthesia: the lesion was anesthetized in a standard fashion   Anesthetic:  1% lidocaine w/ epinephrine 1-100,000 buffered w/ 8.4% NaHCO3 Hemostasis achieved with: pressure, aluminum chloride and electrodesiccation   Outcome: patient tolerated procedure well   Post-procedure details: sterile dressing applied and wound care instructions given   Dressing type: bandage and petrolatum    Specimen 1 - Surgical  pathology Differential Diagnosis: Fibrolipoma vs irritated nevus   Check Margins: No  Fibrolipoma vs irritated nevus   Pityriasis amiantacea   Return in about 1 year (around 02/17/2024) for Pityriasis amiantacea.  I, Angelique Holm, CMA, am acting as scribe for Willeen Niece, MD .   Documentation: I have reviewed the above documentation for accuracy and completeness, and I agree with the above.  Willeen Niece, MD

## 2023-02-17 NOTE — Patient Instructions (Addendum)
Wound Care Instructions  Cleanse wound gently with soap and water once a day then pat dry with clean gauze. Apply a thin coat of Petrolatum (petroleum jelly, "Vaseline") over the wound (unless you have an allergy to this). We recommend that you use a new, sterile tube of Vaseline. Do not pick or remove scabs. Do not remove the yellow or white "healing tissue" from the base of the wound.  Cover the wound with fresh, clean, nonstick gauze and secure with paper tape. You may use Band-Aids in place of gauze and tape if the wound is small enough, but would recommend trimming much of the tape off as there is often too much. Sometimes Band-Aids can irritate the skin.  You should call the office for your biopsy report after 1 week if you have not already been contacted.  If you experience any problems, such as abnormal amounts of bleeding, swelling, significant bruising, significant pain, or evidence of infection, please call the office immediately.  FOR ADULT SURGERY PATIENTS: If you need something for pain relief you may take 1 extra strength Tylenol (acetaminophen) AND 2 Ibuprofen (200mg  each) together every 4 hours as needed for pain. (do not take these if you are allergic to them or if you have a reason you should not take them.) Typically, you may only need pain medication for 1 to 3 days.   Due to recent changes in healthcare laws, you may see results of your pathology and/or laboratory studies on MyChart before the doctors have had a chance to review them. We understand that in some cases there may be results that are confusing or concerning to you. Please understand that not all results are received at the same time and often the doctors may need to interpret multiple results in order to provide you with the best plan of care or course of treatment. Therefore, we ask that you please give [place] 2 business days to thoroughly review all your results before contacting the office for clarification. Should we  see a critical lab result, you will be contacted sooner.   If You Need Anything After Your Visit  If you have any questions or concerns for your doctor, please call our main line at [phone] and press option 4 to reach your doctor's medical assistant. If no one answers, please leave a voicemail as directed and we will return your call as soon as possible. Messages left after 4 pm will be answered the following business day.   You may also send [place] a message via MyChart. We typically respond to MyChart messages within 1-2 business days.  For prescription refills, please ask your pharmacy to contact our office. Our fax number is [phone].  If you have an urgent issue when the clinic is closed that cannot wait until the next business day, you can page your doctor at the number below.    Please note that while we do our best to be available for urgent issues outside of office hours, we are not available 24/7.   If you have an urgent issue and are unable to reach [place], you may choose to seek medical care at your doctor's office, retail clinic, urgent care center, or emergency room.  If you have a medical emergency, please immediately call 911 or go to the emergency department.  Pager Numbers  - Dr. Gwen Pounds: [phone]  - Dr. Roseanne Reno: [phone]  In the event of inclement weather, please call our main line at [phone] for an update on the status  of any delays or closures.  Dermatology Medication Tips: Please keep the boxes that topical medications come in in order to help keep track of the instructions about where and how to use these. Pharmacies typically print the medication instructions only on the boxes and not directly on the medication tubes.   If your medication is too expensive, please contact our office at [phone] option 4 or send [place] a message through MyChart.   We are unable to tell what your co-pay for medications will be in advance as this is different  depending on your insurance coverage. However, we may be able to find a substitute medication at lower cost or fill out paperwork to get insurance to cover a needed medication.   If a prior authorization is required to get your medication covered by your insurance company, please allow [place] 1-2 business days to complete this process.  Drug prices often vary depending on where the prescription is filled and some pharmacies may offer cheaper prices.  The website www.goodrx.com contains coupons for medications through different pharmacies. The prices here do not account for what the cost may be with help from insurance (it may be cheaper with your insurance), but the website can give you the price if you did not use any insurance.  - You can print the associated coupon and take it with your prescription to the pharmacy.  - You may also stop by our office during regular business hours and pick up a GoodRx coupon card.  - If you need your prescription sent electronically to a different pharmacy, notify our office through [facility] or by phone at [phone] option 4.     Si Usted Necesita Algo Despus de Su Visita  Tambin puede enviarnos un mensaje a travs de Clinical cytogeneticist. Por lo general respondemos a los mensajes de MyChart en el transcurso de 1 a 2 das hbiles.  Para renovar recetas, por favor pida a su farmacia que se ponga en contacto con nuestra oficina. Annie Sable de fax es [place] [phone].  Si tiene un asunto urgente cuando la clnica est cerrada y que no puede esperar hasta el siguiente da hbil, puede llamar/localizar a su doctor(a) al nmero que aparece a continuacin.   Por favor, tenga en cuenta que aunque hacemos todo lo posible para estar disponibles para asuntos urgentes fuera del horario de [place], no estamos disponibles las 24 horas del da, los 7 [address]  [address] de la [place].   Si tiene un problema urgente y no puede comunicarse con nosotros, puede optar por buscar atencin  mdica  en el consultorio de su doctor(a), en una clnica privada, en un centro de atencin urgente o en una sala de emergencias.  Si tiene Engineer, drilling, por favor llame inmediatamente al 911 o vaya a la sala de emergencias.  Nmeros de bper  - Dr. Gwen Pounds: [phone]  - Dra. Roseanne Reno: [phone]  En caso de inclemencias del [place], por favor llame a Lacy Duverney principal al [phone] para una actualizacin sobre el [place] de cualquier retraso o cierre.  Consejos para la medicacin en dermatologa: Por favor, guarde las cajas en las que vienen los medicamentos de uso tpico para ayudarle a seguir las instrucciones sobre dnde y cmo usarlos. Las farmacias generalmente imprimen las instrucciones del medicamento slo en las cajas y no directamente en los tubos del [place].   Si su medicamento es muy caro, por favor, pngase en contacto con Rolm Gala llamando al [phone] y presione la opcin 4 o envenos  un mensaje a travs de MyChart.   No podemos decirle cul ser su copago por los medicamentos por adelantado ya que esto es diferente dependiendo de la cobertura de su seguro. Sin embargo, es posible que podamos encontrar un medicamento sustituto a Audiological scientist un formulario para que el seguro cubra el medicamento que se considera necesario.   Si se requiere una autorizacin previa para que su compaa de seguros [place] su medicamento, por favor permtanos de 1 a 2 das hbiles para completar [address]  Los precios de los medicamentos varan con frecuencia dependiendo del Environmental consultant de dnde se surte la receta y alguna farmacias pueden ofrecer precios ms baratos.  El sitio web www.goodrx.com tiene cupones para medicamentos de Health and safety inspector. Los precios aqu no tienen en cuenta lo que podra costar con la ayuda del seguro (puede ser ms barato con su seguro), pero el sitio web puede darle el precio si no utiliz Tourist information centre manager.  - Puede imprimir el  cupn correspondiente y llevarlo con su receta a la farmacia.  - Tambin puede pasar por nuestra oficina durante el horario de atencin regular y Education officer, museum una tarjeta de cupones de GoodRx.  - Si necesita que su receta se enve electrnicamente a una farmacia diferente, informe a nuestra oficina a travs de MyChart de [place] o por telfono llamando al [phone] y presione la opcin 4.

## 2023-02-23 ENCOUNTER — Telehealth: Payer: Self-pay

## 2023-02-23 NOTE — Telephone Encounter (Signed)
Advised patient biopsy of the left popliteal was benign and no further treatment needed.

## 2023-02-23 NOTE — Telephone Encounter (Signed)
-----   Message from Willeen Niece sent at 02/22/2023  7:12 PM EDT ----- Skin , left popliteal FIBROLIPOMA, IRRITATED  Benign irritated fatty growth - please call patient

## 2023-02-24 ENCOUNTER — Telehealth: Payer: Self-pay

## 2023-02-24 NOTE — Telephone Encounter (Signed)
Patient called regarding the biopsy Dr. Roseanne Reno did on the Left popliteal last week. She says the area is starting to turn red around it and very tender to touch. Should patient be concerned of infection? She is using vaseline for wound care.

## 2023-02-25 MED ORDER — MUPIROCIN 2 % EX OINT: 1.0000 | TOPICAL_OINTMENT | Freq: Three times a day (TID) | CUTANEOUS | 1 refills | Status: AC

## 2023-02-25 NOTE — Telephone Encounter (Signed)
Patient advised of information per Dr. Gwen Pounds. She does have allergy to doxycycline. She will try to the Mupirocin ointment three times daily. aw

## 2023-03-16 ENCOUNTER — Ambulatory Visit: Payer: Medicare Other | Admitting: Dermatology

## 2023-05-19 ENCOUNTER — Ambulatory Visit (INDEPENDENT_AMBULATORY_CARE_PROVIDER_SITE_OTHER): Payer: Medicare Other | Admitting: Dermatology

## 2023-05-19 DIAGNOSIS — L0100 Impetigo, unspecified: Secondary | ICD-10-CM

## 2023-05-19 DIAGNOSIS — L719 Rosacea, unspecified: Secondary | ICD-10-CM

## 2023-05-19 MED ORDER — METRONIDAZOLE 0.75 % EX CREA: TOPICAL_CREAM | CUTANEOUS | 5 refills | Status: AC

## 2023-05-19 NOTE — Progress Notes (Signed)
   Follow-Up Visit   Subjective  Monica Gonzales is a 89 y.o. female who presents for the following: Possible rosacea of the nose that started Saturday. Sunday her nose was swollen and tender. She has used metronidazole cream in the past. She had an expired tube that she tried recently.   The following portions of the chart were reviewed this encounter and updated as appropriate: medications, allergies, medical history  Review of Systems:  No other skin or systemic complaints except as noted in HPI or Assessment and Plan.  Objective  Well appearing patient in no apparent distress; mood and affect are within normal limits.  Areas Examined: face  Relevant physical exam findings are noted in the Assessment and Plan.  Nose Pink red yellow crusted patch at left nasal alar rim. Erythema with focal crusting on nose    Assessment & Plan   Impetigo Nose  Start mupirocin 2% ointment apply to AA nose bid, including nostrils  Discussed contagious condition. Wash hands after applying medication. Discussed oral antibiotic if not improving. Pt has allergy to doxycycline, clindamycin, and sulfa.   Impetigo is a superficial bacterial infection of the skin causing crusty nonhealing sores, commonly on the face and neck.  It is contagious and easily spread to different places on the body or to close contacts.  It is treated with topical and sometimes oral antibiotics.  Avoid picking/scratching sores.  Recommend washing hands after touching areas.  When cleansing areas with soap and water, it is recommended to use a new washcloth or disposable cloth each time to prevent spread.   Rosacea   ROSACEA Exam Nose with erythema and inflammatory papules  Chronic and persistent condition with duration or expected duration over one year. Condition is symptomatic/ bothersome to patient. Not currently at goal.   Rosacea is a chronic progressive skin condition usually affecting the face of adults, causing  redness and/or acne bumps. It is treatable but not curable. It sometimes affects the eyes (ocular rosacea) as well. It may respond to topical and/or systemic medication and can flare with stress, sun exposure, alcohol, exercise, topical steroids (including hydrocortisone/cortisone 10) and some foods.  Daily application of broad spectrum spf 30+ sunscreen to face is recommended to reduce flares.  Treatment Plan Restart metronidazole 0.75% cream apply once to twice a day to nose   Return if symptoms worsen or fail to improve.  ICherlyn Labella, CMA, am acting as scribe for Willeen Niece, MD .   Documentation: I have reviewed the above documentation for accuracy and completeness, and I agree with the above.  Willeen Niece, MD

## 2023-05-19 NOTE — Patient Instructions (Addendum)
Restart metronidazole 0.75% cream once to twice daily to nose for rosacea. Start mupirocin 2% ointment Apply to affected area nose and inside nose twice a day x 1-2 weeks until healed.   Due to recent changes in healthcare laws, you may see results of your pathology and/or laboratory studies on MyChart before the doctors have had a chance to review them. We understand that in some cases there may be results that are confusing or concerning to you. Please understand that not all results are received at the same time and often the doctors may need to interpret multiple results in order to provide you with the best plan of care or course of treatment. Therefore, we ask that you please give [place] 2 business days to thoroughly review all your results before contacting the office for clarification. Should we see a critical lab result, you will be contacted sooner.   If You Need Anything After Your Visit  If you have any questions or concerns for your doctor, please call our main line at [phone] and press option 4 to reach your doctor's medical assistant. If no one answers, please leave a voicemail as directed and we will return your call as soon as possible. Messages left after 4 pm will be answered the following business day.   You may also send [place] a message via MyChart. We typically respond to MyChart messages within 1-2 business days.  For prescription refills, please ask your pharmacy to contact our office. Our fax number is [phone].  If you have an urgent issue when the clinic is closed that cannot wait until the next business day, you can page your doctor at the number below.    Please note that while we do our best to be available for urgent issues outside of office hours, we are not available 24/7.   If you have an urgent issue and are unable to reach [place], you may choose to seek medical care at your doctor's office, retail clinic, urgent care center, or emergency room.  If you have a  medical emergency, please immediately call 911 or go to the emergency department.  Pager Numbers  - Dr. Gwen Pounds: [phone]  - Dr. Roseanne Reno: [phone]  - Dr. Katrinka Blazing: [phone]   In the event of inclement weather, please call our main line at [phone] for an update on the status of any delays or closures.  Dermatology Medication Tips: Please keep the boxes that topical medications come in in order to help keep track of the instructions about where and how to use these. Pharmacies typically print the medication instructions only on the boxes and not directly on the medication tubes.   If your medication is too expensive, please contact our office at [phone] option 4 or send [place] a message through MyChart.   We are unable to tell what your co-pay for medications will be in advance as this is different depending on your insurance coverage. However, we may be able to find a substitute medication at lower cost or fill out paperwork to get insurance to cover a needed medication.   If a prior authorization is required to get your medication covered by your insurance company, please allow [place] 1-2 business days to complete this process.  Drug prices often vary depending on where the prescription is filled and some pharmacies may offer cheaper prices.  The website www.goodrx.com contains coupons for medications through different pharmacies. The prices here do not account for what the cost may be with help from  insurance (it may be cheaper with your insurance), but the website can give you the price if you did not use any insurance.  - You can print the associated coupon and take it with your prescription to the pharmacy.  - You may also stop by our office during regular business hours and pick up a GoodRx coupon card.  - If you need your prescription sent electronically to a different pharmacy, notify our office through [facility] or by phone at [phone] option 4.     Si  Usted Necesita Algo Despus de Su Visita  Tambin puede enviarnos un mensaje a travs de Clinical cytogeneticist. Por lo general respondemos a los mensajes de MyChart en el transcurso de 1 a 2 das hbiles.  Para renovar recetas, por favor pida a su farmacia que se ponga en contacto con nuestra oficina. Annie Sable de fax es [place] [phone].  Si tiene un asunto urgente cuando la clnica est cerrada y que no puede esperar hasta el siguiente da hbil, puede llamar/localizar a su doctor(a) al nmero que aparece a continuacin.   Por favor, tenga en cuenta que aunque hacemos todo lo posible para estar disponibles para asuntos urgentes fuera del horario de [place], no estamos disponibles las 24 horas del da, los 7 [address]  [address] de la [place].   Si tiene un problema urgente y no puede comunicarse con nosotros, puede optar por buscar atencin mdica  en el consultorio de su doctor(a), en una clnica privada, en un centro de atencin urgente o en una sala de emergencias.  Si tiene Engineer, drilling, por favor llame inmediatamente al 911 o vaya a la sala de emergencias.  Nmeros de bper  - Dr. Gwen Pounds: [phone]  - Dra. Roseanne Reno: [phone]  - Dr. Katrinka Blazing: [phone]   En caso de inclemencias del tiempo, por favor llame a Lacy Duverney principal al [phone] para una actualizacin sobre el [place] de cualquier retraso o cierre.  Consejos para la medicacin en dermatologa: Por favor, guarde las cajas en las que vienen los medicamentos de uso tpico para ayudarle a seguir las instrucciones sobre dnde y cmo usarlos. Las farmacias generalmente imprimen las instrucciones del medicamento slo en las cajas y no directamente en los tubos del [place].   Si su medicamento es muy caro, por favor, pngase en contacto con Rolm Gala llamando al [phone] y presione la opcin 4 o envenos un mensaje a travs de Clinical cytogeneticist.   No podemos decirle cul ser su copago por los medicamentos por adelantado ya que  esto es diferente dependiendo de la cobertura de su seguro. Sin embargo, es posible que podamos encontrar un medicamento sustituto a Audiological scientist un formulario para que el seguro cubra el medicamento que se considera necesario.   Si se requiere una autorizacin previa para que su compaa de seguros [place] su medicamento, por favor permtanos de 1 a 2 das hbiles para completar [address]  Los precios de los medicamentos varan con frecuencia dependiendo del Environmental consultant de dnde se surte la receta y alguna farmacias pueden ofrecer precios ms baratos.  El sitio web www.goodrx.com tiene cupones para medicamentos de Health and safety inspector. Los precios aqu no tienen en cuenta lo que podra costar con la ayuda del seguro (puede ser ms barato con su seguro), pero el sitio web puede darle el precio si no utiliz Tourist information centre manager.  - Puede imprimir el cupn correspondiente y llevarlo con su receta a la farmacia.  - Tambin puede pasar por nuestra oficina durante el horario  de atencin regular y Education officer, museum una tarjeta de cupones de GoodRx.  - Si necesita que su receta se enve electrnicamente a una farmacia diferente, informe a nuestra oficina a travs de MyChart de [place] o por telfono llamando al [phone] y presione la opcin 4.

## 2023-09-09 ENCOUNTER — Other Ambulatory Visit: Payer: Self-pay | Admitting: Orthopedic Surgery

## 2023-09-09 DIAGNOSIS — M4807 Spinal stenosis, lumbosacral region: Secondary | ICD-10-CM

## 2023-09-16 ENCOUNTER — Ambulatory Visit
Admission: RE | Admit: 2023-09-16 | Discharge: 2023-09-16 | Disposition: A | Discharge status: Home / Self Care | Payer: Medicare Other | Source: Ambulatory Visit | Attending: Orthopedic Surgery | Admitting: Orthopedic Surgery

## 2023-09-16 DIAGNOSIS — M4807 Spinal stenosis, lumbosacral region: Secondary | ICD-10-CM | POA: Insufficient documentation

## 2023-10-04 ENCOUNTER — Other Ambulatory Visit: Payer: Self-pay | Admitting: Family Medicine

## 2023-10-04 ENCOUNTER — Inpatient Hospital Stay
Admission: RE | Admit: 2023-10-04 | Discharge: 2023-10-04 | Disposition: A | Discharge status: Home / Self Care | Payer: Self-pay | Source: Ambulatory Visit | Attending: Neurosurgery | Admitting: Neurosurgery

## 2023-10-04 DIAGNOSIS — Z049 Encounter for examination and observation for unspecified reason: Secondary | ICD-10-CM

## 2023-10-18 ENCOUNTER — Ambulatory Visit: Admitting: Neurosurgery

## 2024-02-12 ENCOUNTER — Ambulatory Visit: Payer: Self-pay

## 2024-02-25 ENCOUNTER — Other Ambulatory Visit: Payer: Self-pay | Admitting: Dermatology

## 2024-02-25 DIAGNOSIS — L448 Other specified papulosquamous disorders: Secondary | ICD-10-CM

## 2024-03-20 ENCOUNTER — Ambulatory Visit (INDEPENDENT_AMBULATORY_CARE_PROVIDER_SITE_OTHER): Payer: Medicare Other | Admitting: Dermatology

## 2024-03-20 ENCOUNTER — Encounter: Payer: Self-pay | Admitting: Dermatology

## 2024-03-20 DIAGNOSIS — D1801 Hemangioma of skin and subcutaneous tissue: Secondary | ICD-10-CM

## 2024-03-20 DIAGNOSIS — L814 Other melanin hyperpigmentation: Secondary | ICD-10-CM | POA: Diagnosis not present

## 2024-03-20 DIAGNOSIS — L448 Other specified papulosquamous disorders: Secondary | ICD-10-CM

## 2024-03-20 MED ORDER — KETOCONAZOLE 2 % EX SHAM: MEDICATED_SHAMPOO | CUTANEOUS | 11 refills | Status: AC

## 2024-03-20 NOTE — Patient Instructions (Signed)

## 2024-03-20 NOTE — Progress Notes (Signed)
   Follow-Up Visit   Subjective  Monica Gonzales is a 89 y.o. female who presents for the following: Pityriasis amiantacea scalp, Ketoconazole  2% shampoo 1x/wk, doing well.  Hairdresser says scalp is clear   The following portions of the chart were reviewed this encounter and updated as appropriate: medications, allergies, medical history  Review of Systems:  No other skin or systemic complaints except as noted in HPI or Assessment and Plan.  Objective  Well appearing patient in no apparent distress; mood and affect are within normal limits.   A focused examination was performed of the following areas: scalp  Relevant exam findings are noted in the Assessment and Plan.    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 Currently well-controlled.   Treatment Plan: Cont Ketoconazole  2% shampoo 1x/wk, let sit on scalp 10 minutes and rinse out   HEMANGIOMA R scalp, crown scalp Exam: red papule(s) Discussed benign nature. Recommend observation. Call for changes.  LENTIGINES Exam: scattered tan macules face Due to sun exposure Treatment Plan: Benign-appearing, observe. Recommend daily broad spectrum sunscreen SPF 30+ to sun-exposed areas, reapply every 2 hours as needed.  Call for any changes   PITYRIASIS AMIANTACEA   Related Medications ketoconazole  (NIZORAL ) 2 % shampoo Apply topically once a week.  Return in about 1 year (around 03/20/2025) for Pityriasis Amiantacea f/u.  I, Grayce Saunas, RMA, am acting as scribe for Rexene Rattler, MD .   Documentation: I have reviewed the above documentation for accuracy and completeness, and I agree with the above.  Rexene Rattler, MD

## 2025-03-20 ENCOUNTER — Encounter: Admitting: Dermatology
